# Patient Record
Sex: Female | Born: 1970 | Race: White | Hispanic: No | Marital: Married | Smoking: Never smoker
Health system: Southern US, Community
[De-identification: ages and names within clinical notes are randomized; demographics above are authoritative.]

## PROBLEM LIST (undated history)

## (undated) DIAGNOSIS — E669 Obesity, unspecified: Secondary | ICD-10-CM

## (undated) HISTORY — DX: Obesity, unspecified: E66.9

## (undated) HISTORY — PX: GASTRIC BYPASS: SHX52

## (undated) HISTORY — PX: CHOLECYSTECTOMY: SHX55

---

## 2005-06-26 ENCOUNTER — Emergency Department: Payer: Self-pay | Admitting: Unknown Physician Specialty

## 2009-04-25 ENCOUNTER — Ambulatory Visit: Payer: Self-pay | Admitting: Family Medicine

## 2009-05-02 ENCOUNTER — Ambulatory Visit: Payer: Self-pay | Admitting: Family Medicine

## 2009-12-18 IMAGING — CT CT ABD-PELV W/ CM
1 of 2 series · 15 of 32 positions shown, 19 images · non-contrast
Comparison: none

REASON FOR EXAM: abdominal pain  nodular density seen in last ct done
[DATE]
COMMENTS:

[Series 3: abdomen · axial · 0.82mm/px · z∈[+32,+440]mm · 15 of 148 slices shown, 19 images]
[im 6/148  soft-tissue]
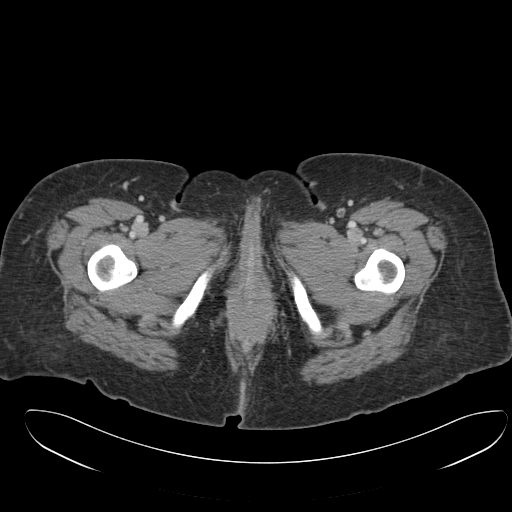
[im 6/148  bone]
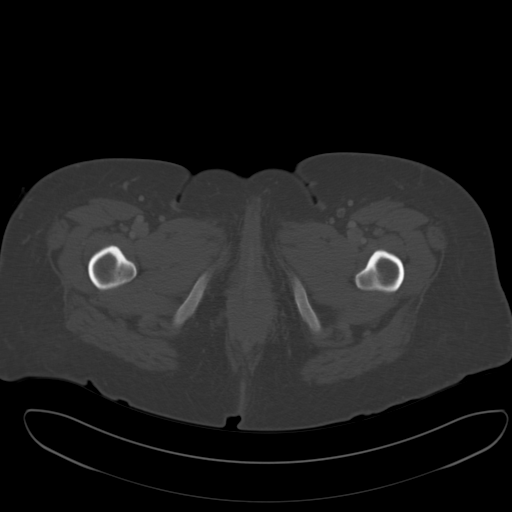
[im 18/148  soft-tissue]
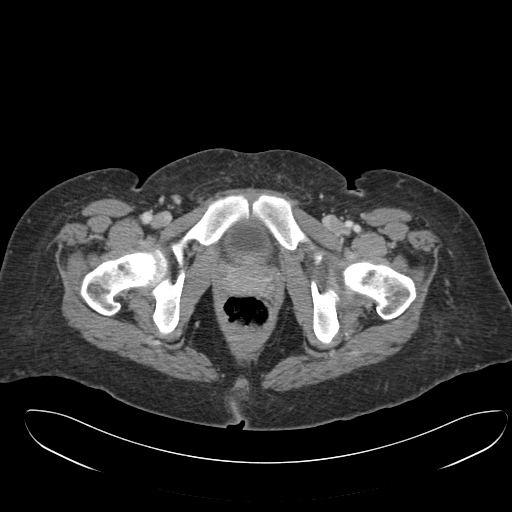
[im 30/148  soft-tissue]
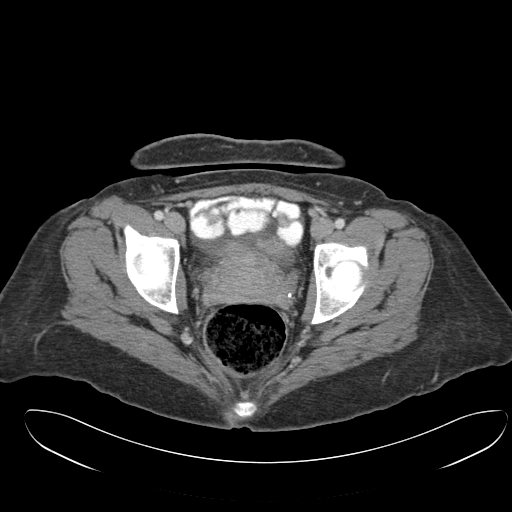
[im 42/148  soft-tissue]
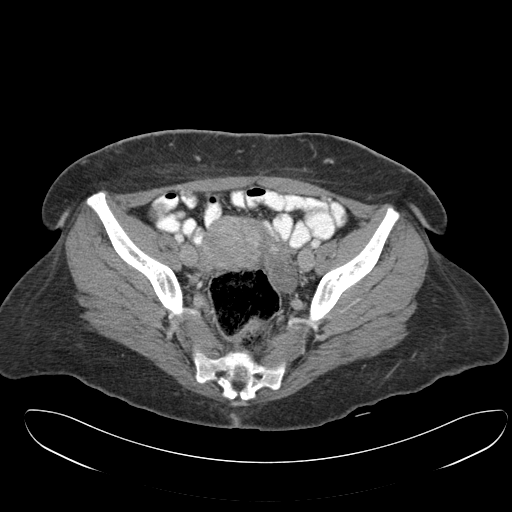
[im 53/148  soft-tissue]
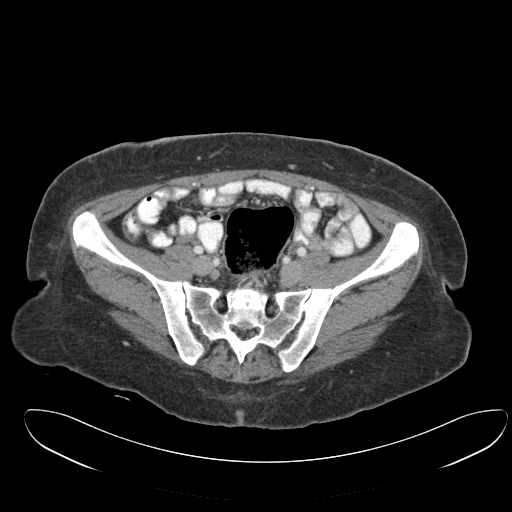
[im 65/148  soft-tissue]
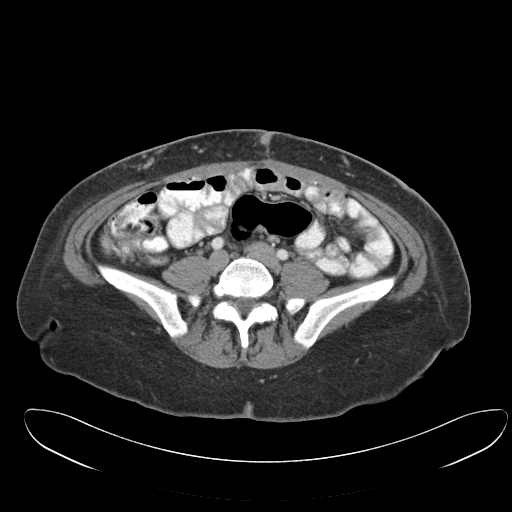
[im 77/148  soft-tissue]
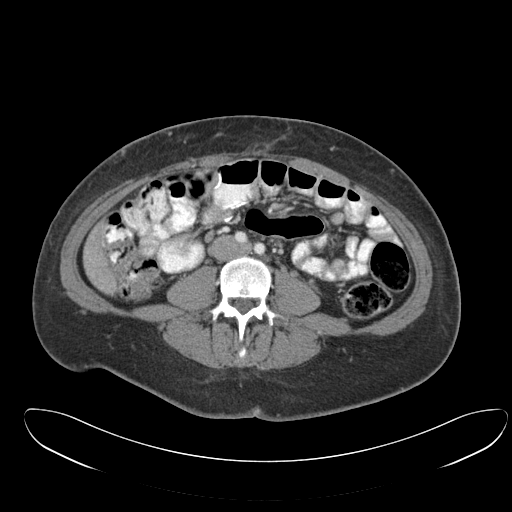
[im 83/148  soft-tissue]
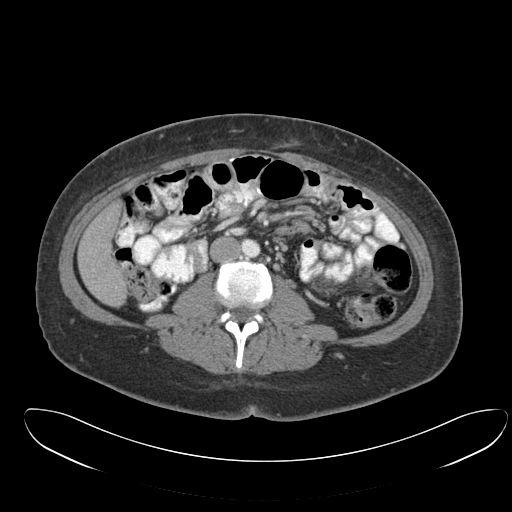
[im 95/148  soft-tissue]
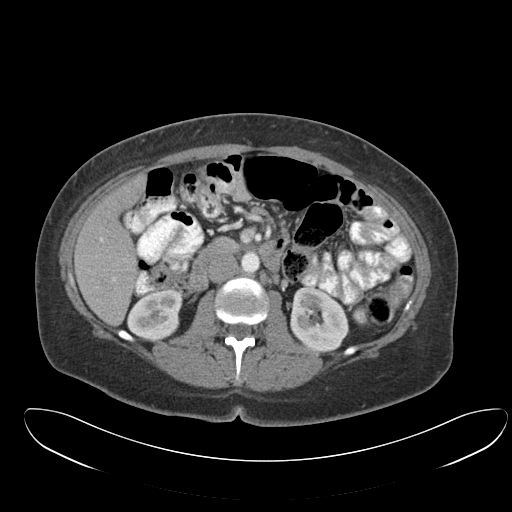
[im 95/148  bone]
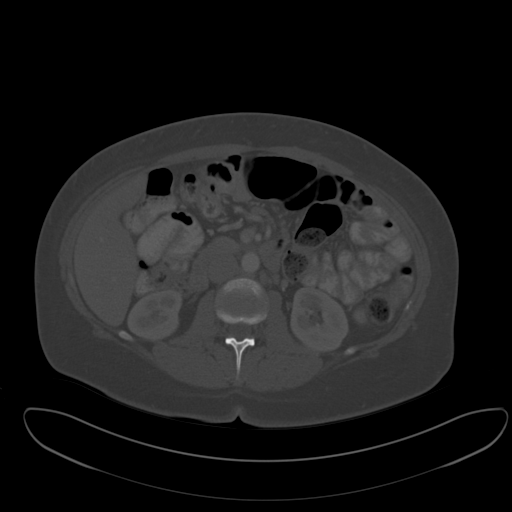
[im 106/148  soft-tissue]
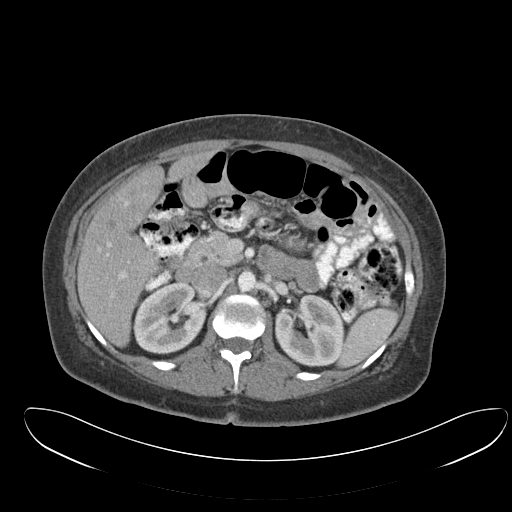
[im 118/148  soft-tissue]
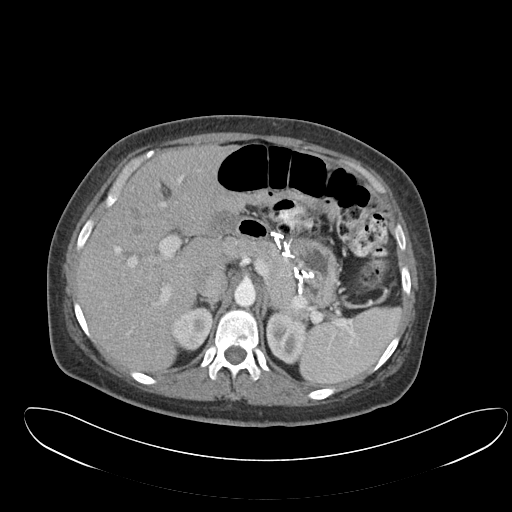
[im 124/148  lung]
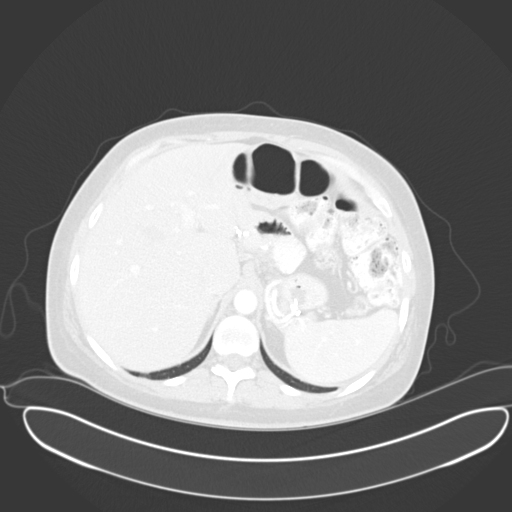
[im 130/148  soft-tissue]
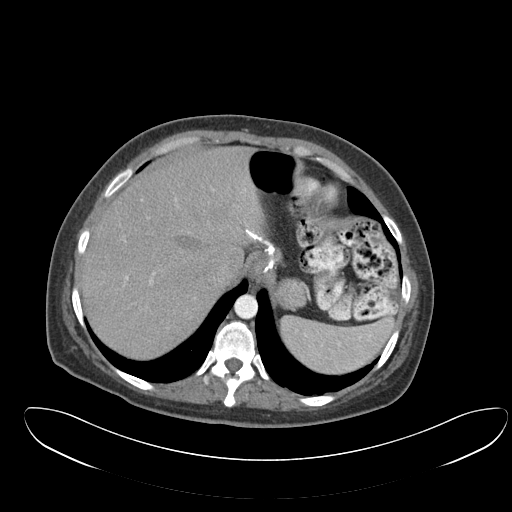
[im 130/148  lung]
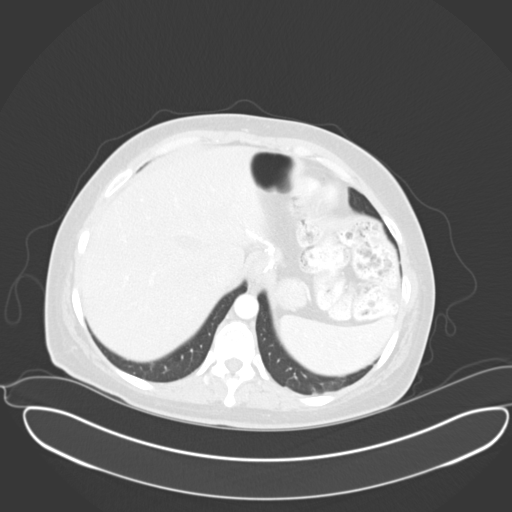
[im 136/148  lung]
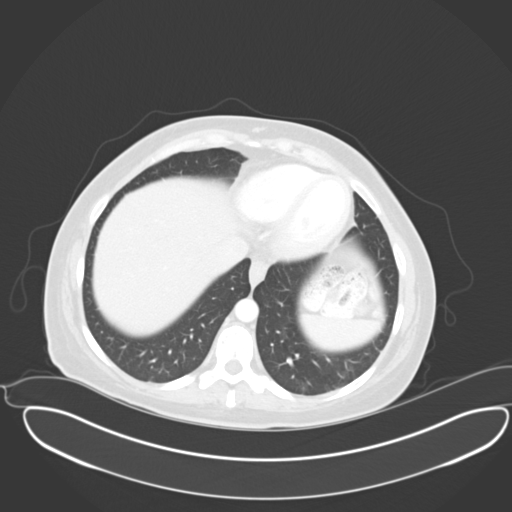
[im 142/148  soft-tissue]
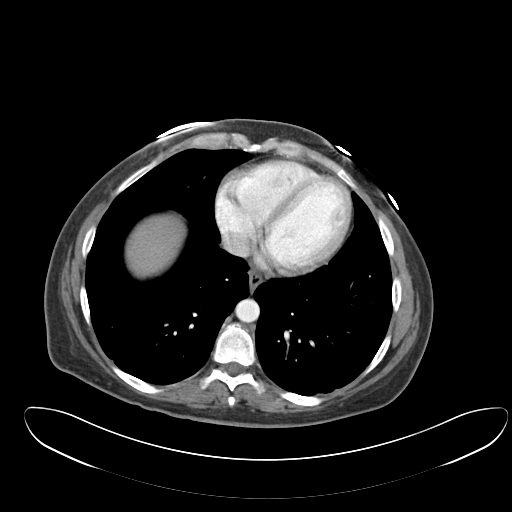
[im 142/148  lung]
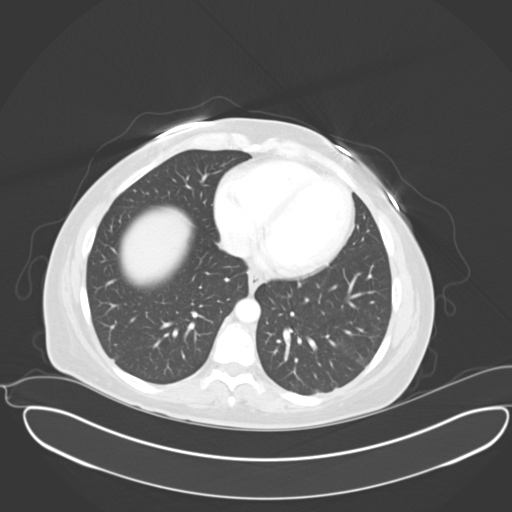

[15 of 32 positions shown; findings below may reference images not displayed]

PROCEDURE:     CT  - CT ABDOMEN / PELVIS  W  - May 02, 2009  [DATE]

RESULT:     CT of the abdomen and pelvis is performed utilizing 100 ml of
[id number] iodinated intravenous contrast along with oral contrast.
Comparison is made to a non-contrast exam dated 04/25/2009.

Images through the base of the lungs demonstrate grossly normal aeration.
There is some minimal subpleural density which is likely secondary to
atelectasis in the dependent regions.

The nodular densities in the retroperitoneum seen previously are likely
secondary to a prominent crus of the diaphragm. On the left there are
gonadal veins demonstrated which may account for some of the nodular density
as well. There does not appear to be definite adenopathy. There is no acute
inflammation. The aorta enhances normally. The liver, spleen, kidneys and
pancreas appear to be within normal limits. There is no abnormal bowel
distention, free air or abnormal fluid collection aside from the previously
demonstrated left adnexal cyst. The appendix is seen and is normal.
IMPRESSION: No definite evidence of retroperitoneal adenopathy or mass.
The appearance was secondary to a prominent interaorto-crus of the diaphragm
and some para-aortic prominence of the gonadal vein.

## 2011-11-24 ENCOUNTER — Emergency Department: Payer: Self-pay | Admitting: Emergency Medicine

## 2013-05-06 ENCOUNTER — Emergency Department: Payer: Self-pay | Admitting: Emergency Medicine

## 2013-05-06 LAB — COMPREHENSIVE METABOLIC PANEL
Alkaline Phosphatase: 52 U/L (ref 50–136)
Anion Gap: 4 — ABNORMAL LOW (ref 7–16)
BUN: 11 mg/dL (ref 7–18)
Bilirubin,Total: 0.3 mg/dL (ref 0.2–1.0)
Calcium, Total: 8.7 mg/dL (ref 8.5–10.1)
Chloride: 107 mmol/L (ref 98–107)
Creatinine: 0.81 mg/dL (ref 0.60–1.30)
EGFR (African American): >60
Glucose: 76 mg/dL (ref 65–99)
Osmolality: 274 (ref 275–301)
SGOT(AST): 28 U/L (ref 15–37)
Sodium: 138 mmol/L (ref 136–145)

## 2013-05-06 LAB — URINALYSIS, COMPLETE
Ph: 6 (ref 4.5–8.0)
Protein: NEGATIVE
RBC,UR: 5 /HPF (ref 0–5)
Specific Gravity: 1.014 (ref 1.003–1.030)
Squamous Epithelial: 7
WBC UR: 6 /HPF (ref 0–5)

## 2013-05-06 LAB — CBC
MCH: 28.2 pg (ref 26.0–34.0)
MCHC: 33.2 g/dL (ref 32.0–36.0)
MCV: 85 fL (ref 80–100)
Platelet: 220 10*3/uL (ref 150–440)
RBC: 4.78 10*6/uL (ref 3.80–5.20)
RDW: 15.3 % — ABNORMAL HIGH (ref 11.5–14.5)
WBC: 9.4 10*3/uL (ref 3.6–11.0)

## 2013-05-06 LAB — LIPASE, BLOOD: Lipase: 230 U/L (ref 73–393)

## 2013-12-22 IMAGING — CR DG ABDOMEN 1V
1 series · 2 of 2 positions shown · non-contrast
Comparison: none

REASON FOR EXAM: right sided abdominal pain
COMMENTS:

PROCEDURE:     DXR - DXR KIDNEY URETER BLADDER  - May 06, 2013  [DATE]
RESULT:

[Series 1: t abdomen supine · 0.14mm/px · 2 of 2 slices shown]
[im 1/2]
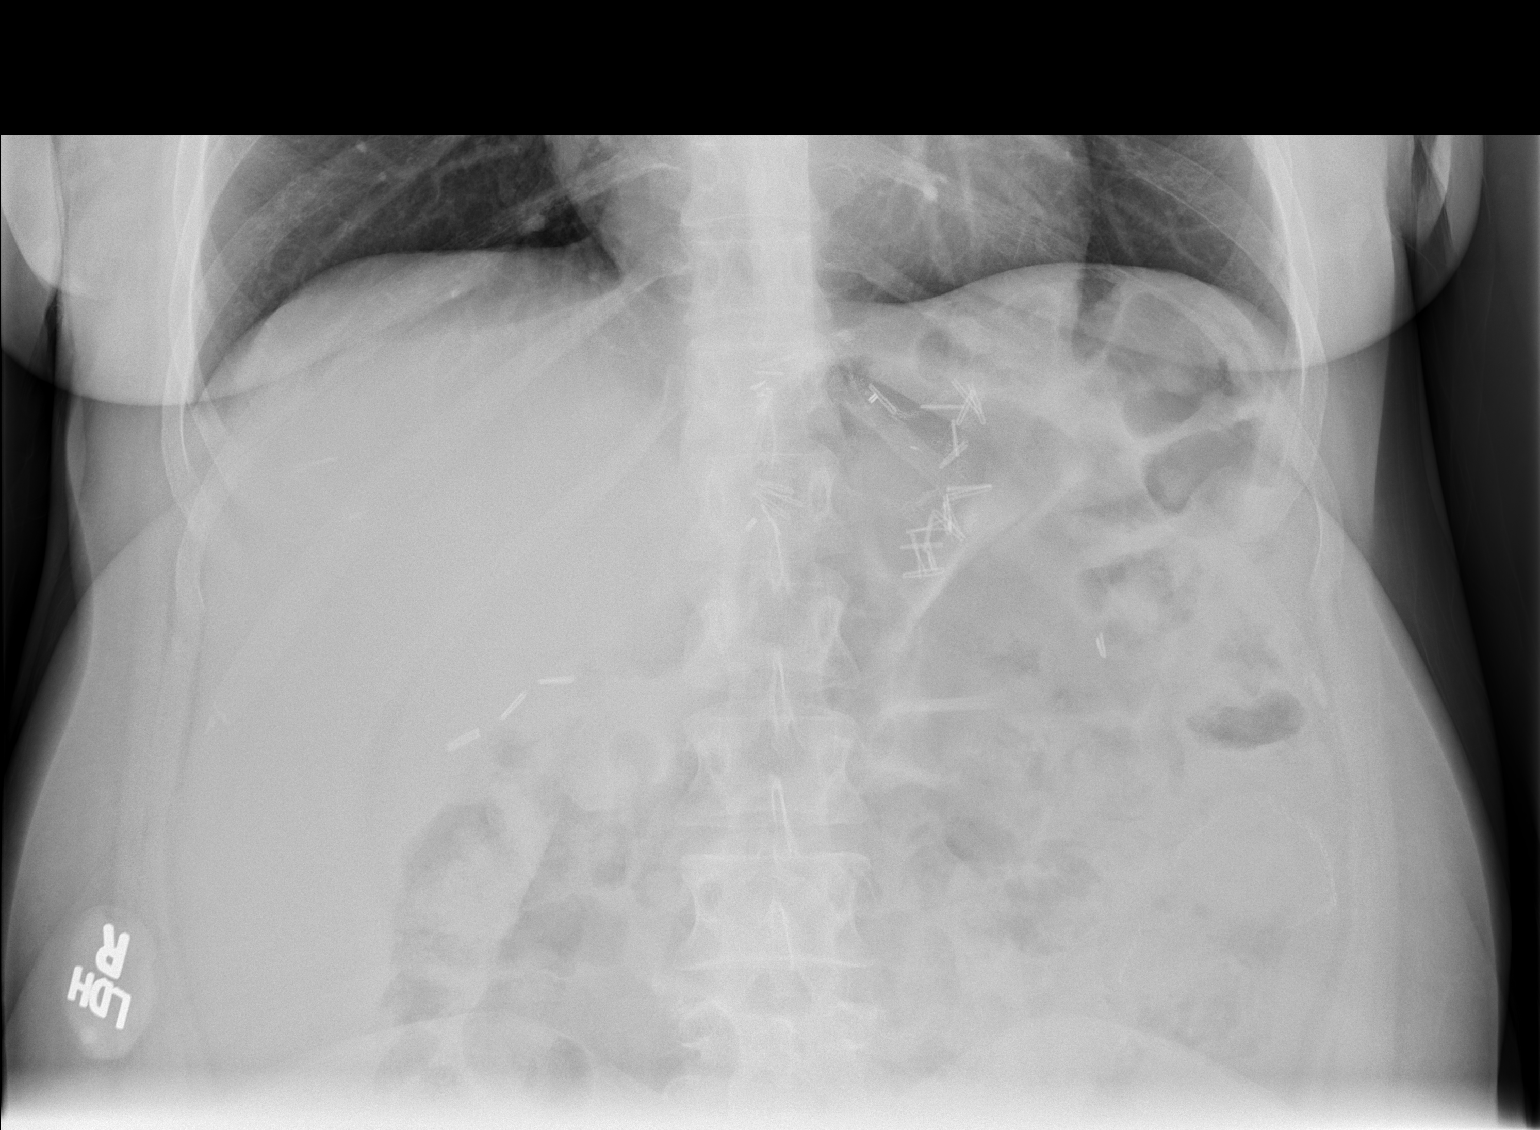
[im 2/2]
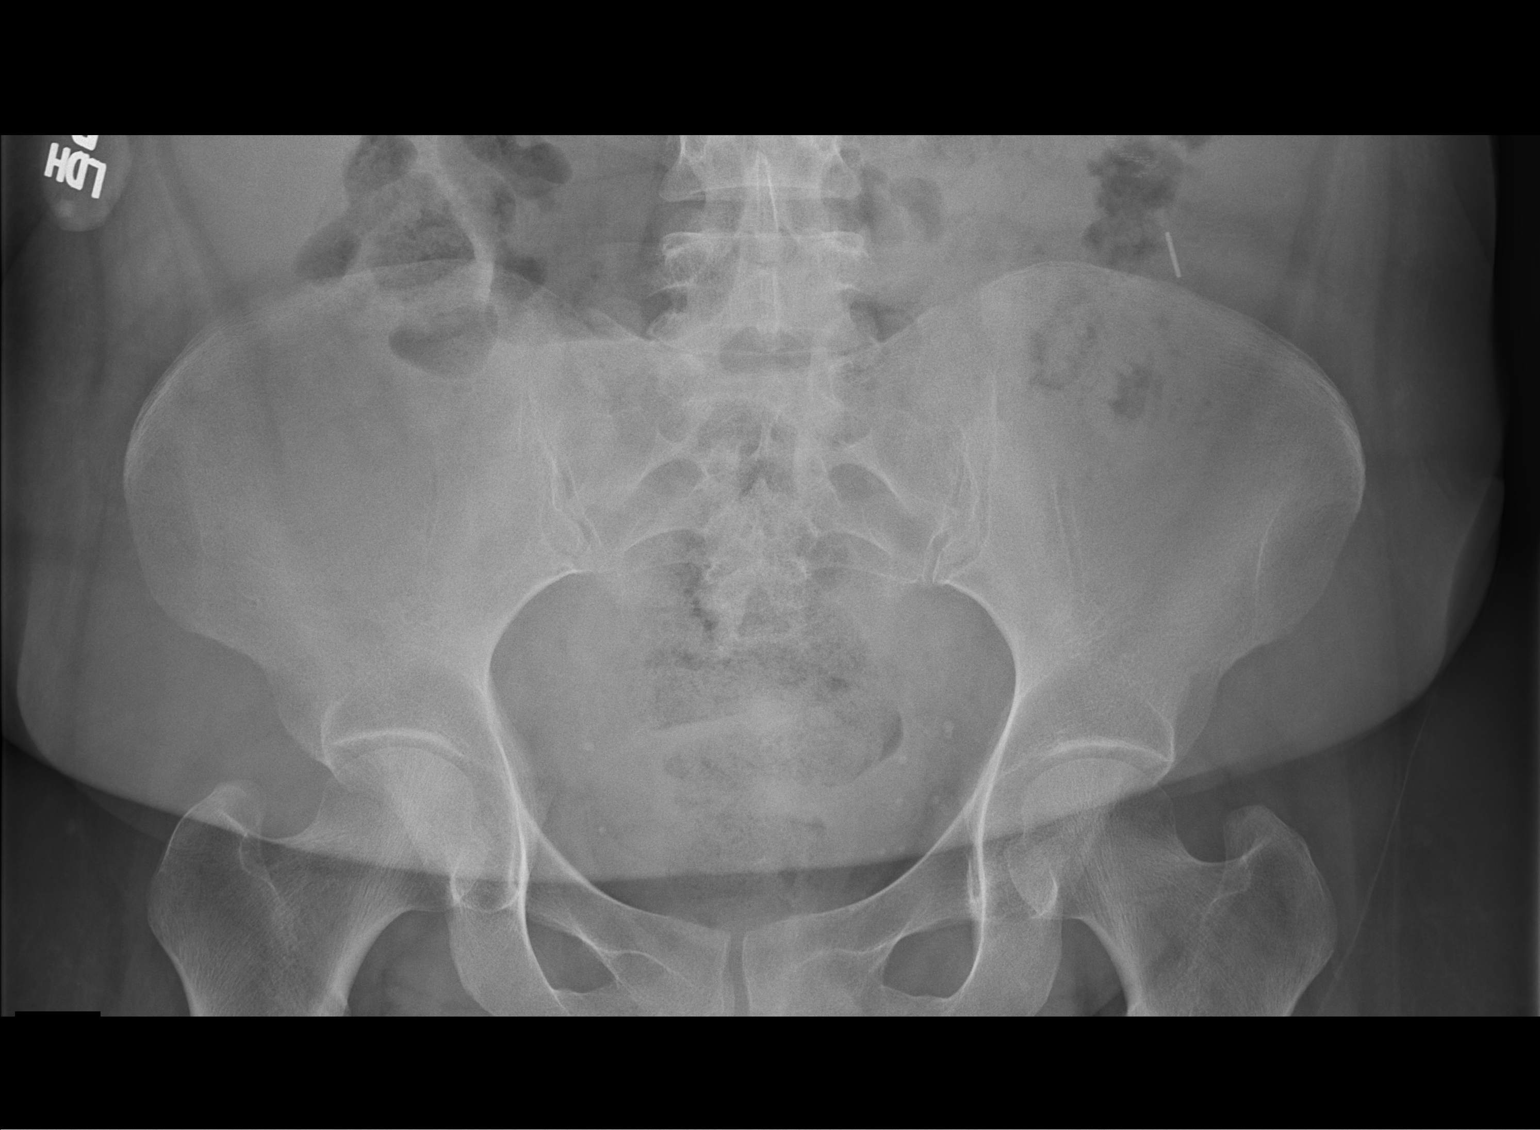

[2 of 2 positions shown; findings below may reference images not displayed]

FINDINGS: Air is seen within nondilated loops of large and small bowel. A
moderate to large amount of stool is appreciated. There does not appear to
be evidence of calcified densities projecting along the expected course of
the right or left ureter. The visualized bony skeleton demonstrates no
evidence of fracture or dislocation.
IMPRESSION: Nonobstructive bowel gas pattern with a moderate to large
amount of stool.

## 2015-11-23 ENCOUNTER — Ambulatory Visit
Admission: EM | Admit: 2015-11-23 | Discharge: 2015-11-23 | Disposition: A | Discharge status: Home / Self Care | Payer: BLUE CROSS/BLUE SHIELD | Attending: Family Medicine | Admitting: Family Medicine

## 2015-11-23 DIAGNOSIS — H6983 Other specified disorders of Eustachian tube, bilateral: Secondary | ICD-10-CM | POA: Diagnosis not present

## 2015-11-23 DIAGNOSIS — J0110 Acute frontal sinusitis, unspecified: Secondary | ICD-10-CM

## 2015-11-23 DIAGNOSIS — R05 Cough: Secondary | ICD-10-CM

## 2015-11-23 DIAGNOSIS — R059 Cough, unspecified: Secondary | ICD-10-CM

## 2015-11-23 MED ORDER — AMOXICILLIN-POT CLAVULANATE 875-125 MG PO TABS: 1.0000 | ORAL_TABLET | Freq: Two times a day (BID) | ORAL | Status: AC

## 2015-11-23 MED ORDER — BENZONATATE 100 MG PO CAPS: 100.0000 mg | ORAL_CAPSULE | Freq: Three times a day (TID) | ORAL | Status: DC | PRN

## 2015-11-23 NOTE — ED Notes (Signed)
Patient complains of sinus pain and pressure with drainage and congestion. Patient complains of facial pain. States that symptoms started around 2-3 weeks ago and she is just unable to get rid of symptoms. Patient states that she has been also having a headache and has tried multiple OTC treatment for sinus pain without relief.

## 2015-11-23 NOTE — ED Provider Notes (Signed)
CSN: [id number]     Arrival date & time 11/23/15  1017 History   First MD Initiated Contact with Patient 11/23/15 1428     Chief Complaint  Patient presents with  . Sinusitis   (Consider location/radiation/quality/duration/timing/severity/associated sxs/prior Treatment) HPI   45 yo F with about 2 1/2-3 week hx of nasal congestion and cough, stuffy ears, runny nose.  - now with facial pain , upper tooth pain and bilaterally full feeling ears. Has tried many OTC products with short term or minimal improvemment. Ears feel underwater, head is in a box..."and now my face hurts" Using Nyquil to sleep. Has Flonase and cetirizine  but hasn't been using because it is not summer yet.   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 Hard work.   History reviewed. No pertinent past medical history. Past Surgical History  Procedure Laterality Date  . Cesarean section    . Cholecystectomy    . Gastric bypass     Family History  Problem Relation Age of Onset  . Lung cancer Father    Social History  Substance Use Topics  . Smoking status: Never Smoker   . Smokeless tobacco: None  . Alcohol Use: No   OB History    No data available     Review of Systems Review of 10 systems negative for acute change except as referenced in HPI   Allergies  Review of patient's allergies indicates no known allergies.  Home Medications   Prior to Admission medications   Medication Sig Start Date End Date Taking? Authorizing Provider  amoxicillin-clavulanate (AUGMENTIN) 875-125 MG tablet Take 1 tablet by mouth every 12 (twelve) hours. 11/23/15 12/03/15  Jan Fireman, PA-C  benzonatate (TESSALON) 100 MG capsule Take 1 capsule (100 mg total) by mouth 3 (three) times daily as needed. 11/23/15   Jan Fireman, PA-C   Meds Ordered and Administered this Visit  Medications - No data to display  BP 144/93 mmHg  Pulse 64  Temp(Src) 97.8 F (36.6 C) (Tympanic)  Resp 16  Ht 5' 3"  (1.6 m)   Wt 195 lb (88.451 kg)  BMI 34.55 kg/m2  SpO2 100% No data found.   Physical Exam  VS noted, WNL, pleasant F obviously tired of not feeling well GENERAL : NAD HEENT: no pharyngeal erythema,no exudate, cobblestoning B canals clear,no erythema of TMs, bilateral retraction with deviated light reflex  no cervical LAD RESP: CTA  B , no wheezing, no accessory muscle use CARD: RRR ABD: Not distended, non-tender MSK/Skin- reports an area on anterior right upper arm about the size of a silver dollar that intermitttently "gets tingly feeling"- there is nothing obvious clinically at this time, no rash or irritation, annular presentation reported, not dermatome; Skin sensitivity to touch grossly normal and DTRs appropriate full range of motion shoulder and neck.Odd feeling not currently present. NEURO: Good attention, good recall, no gross neuro defecit PSYCH: speech and behavior appropriate  ED Course  Procedures (including critical care time)  Labs Review Labs Reviewed - No data to display  Imaging Review No results found.    MDM   1. Acute frontal sinusitis, recurrence not specified   2. Cough   3. Eustachian tube dysfunction, bilateral     Diagnosis and treatment discussed.-handout given  Retrun to cetirizine and flonase--use both BID for about 3 days, then reduce to daily.  Add Augmentin, increase fluids/hydrations/steamy showers/vaporizer/gentle valslava Continue NyQuil if desired  Questions fielded, expectations and recommendations  reviewed. Discussed follow up and return parameters including no resolution or any worsening condition. . Patient expresses understanding  and agrees to plan.  Will return to [facility] with questions, concerns or exacerbation.   Discharge Medication List as of 11/23/2015  2:47 PM    START taking these medications   Details  amoxicillin-clavulanate (AUGMENTIN) 875-125 MG tablet Take 1 tablet by mouth every 12 (twelve) hours., Starting 11/23/2015,  Until Tue 12/03/15, Normal    benzonatate (TESSALON) 100 MG capsule Take 1 capsule (100 mg total) by mouth 3 (three) times daily as needed., Starting 11/23/2015, Until Discontinued, Normal          DWAN FENNEL, PA-C 11/25/15 1450

## 2015-11-23 NOTE — Discharge Instructions (Signed)
Antibiotic Rx 1 tablet twice daily for 10 days Tessalon perles 1 -2 every 6 hours as needed for cough Robitussin DM 1 tsp every 4-6 hrs Steamy showers- increase fluids by mouth- void about every 2 hours  Sinusitis, Adult Sinusitis is redness, soreness, and inflammation of the paranasal sinuses. Paranasal sinuses are air pockets within the bones of your face. They are located beneath your eyes, in the middle of your forehead, and above your eyes. In healthy paranasal sinuses, mucus is able to drain out, and air is able to circulate through them by way of your nose. However, when your paranasal sinuses are inflamed, mucus and air can become trapped. This can allow bacteria and other germs to grow and cause infection. Sinusitis can develop quickly and last only a short time (acute) or continue over a long period (chronic). Sinusitis that lasts for more than 12 weeks is considered chronic. CAUSES Causes of sinusitis include:  Allergies.  Structural abnormalities, such as displacement of the cartilage that separates your nostrils (deviated septum), which can decrease the air flow through your nose and sinuses and affect sinus drainage.  Functional abnormalities, such as when the small hairs (cilia) that line your sinuses and help remove mucus do not work properly or are not present. SIGNS AND SYMPTOMS Symptoms of acute and chronic sinusitis are the same. The primary symptoms are pain and pressure around the affected sinuses. Other symptoms include:  Upper toothache.  Earache.  Headache.  Bad breath.  Decreased sense of smell and taste.  A cough, which worsens when you are lying flat.  Fatigue.  Fever.  Thick drainage from your nose, which often is green and may contain pus (purulent).  Swelling and warmth over the affected sinuses. DIAGNOSIS Your health care provider will perform a physical exam. During your exam, your health care provider may perform any of the following to help  determine if you have acute sinusitis or chronic sinusitis:  Look in your nose for signs of abnormal growths in your nostrils (nasal polyps).  Tap over the affected sinus to check for signs of infection.  View the inside of your sinuses using an imaging device that has a light attached (endoscope). If your health care provider suspects that you have chronic sinusitis, one or more of the following tests may be recommended:  Allergy tests.  Nasal culture. A sample of mucus is taken from your nose, sent to a lab, and screened for bacteria.  Nasal cytology. A sample of mucus is taken from your nose and examined by your health care provider to determine if your sinusitis is related to an allergy. TREATMENT Most cases of acute sinusitis are related to a viral infection and will resolve on their own within 10 days. Sometimes, medicines are prescribed to help relieve symptoms of both acute and chronic sinusitis. These may include pain medicines, decongestants, nasal steroid sprays, or saline sprays. However, for sinusitis related to a bacterial infection, your health care provider will prescribe antibiotic medicines. These are medicines that will help kill the bacteria causing the infection. Rarely, sinusitis is caused by a fungal infection. In these cases, your health care provider will prescribe antifungal medicine. For some cases of chronic sinusitis, surgery is needed. Generally, these are cases in which sinusitis recurs more than 3 times per year, despite other treatments. HOME CARE INSTRUCTIONS  Drink plenty of water. Water helps thin the mucus so your sinuses can drain more easily.  Use a humidifier.  Inhale steam 3-4 times a  day (for example, sit in the bathroom with the shower running).  Apply a warm, moist washcloth to your face 3-4 times a day, or as directed by your health care provider.  Use saline nasal sprays to help moisten and clean your sinuses.  Take medicines only as  directed by your health care provider.  If you were prescribed either an antibiotic or antifungal medicine, finish it all even if you start to feel better. SEEK IMMEDIATE MEDICAL CARE IF:  You have increasing pain or severe headaches.  You have nausea, vomiting, or drowsiness.  You have swelling around your face.  You have vision problems.  You have a stiff neck.  You have difficulty breathing.   This information is not intended to replace advice given to you by your health care provider. Make sure you discuss any questions you have with your health care provider.   Document Released: 09/21/2005 Document Revised: 10/12/2014 Document Reviewed: 10/06/2011 Elsevier Interactive Patient Education 2016 [employer] media is inflammation of your middle ear. This occurs when the auditory tube (eustachian tube) leading from the back of your nose (nasopharynx) to your eardrum is blocked. This blockage may result from a cold, environmental allergies, or an upper respiratory infection. Unresolved barotitis media may lead to damage or hearing loss (barotrauma), which may become permanent. HOME CARE INSTRUCTIONS   Use medicines as recommended by your health care provider. Over-the-counter medicines will help unblock the canal and can help during times of air travel.  Do not put anything into your ears to clean or unplug them. Eardrops will not be helpful.  Do not swim, dive, or fly until your health care provider says it is all right to do so. If these activities are necessary, chewing gum with frequent, forceful swallowing may help. It is also helpful to hold your nose and gently blow to pop your ears for equalizing pressure changes. This forces air into the eustachian tube.  Only take over-the-counter or prescription medicines for pain, discomfort, or fever as directed by your health care provider.  A decongestant may be helpful in decongesting the middle ear and  make pressure equalization easier. SEEK MEDICAL CARE IF:  You experience a serious form of dizziness in which you feel as if the room is spinning and you feel nauseated (vertigo).  Your symptoms only involve one ear. SEEK IMMEDIATE MEDICAL CARE IF:   You develop a severe headache, dizziness, or severe ear pain.  You have bloody or pus-like drainage from your ears.  You develop a fever.  Your problems do not improve or become worse. MAKE SURE YOU:   Understand these instructions.  Will watch your condition.  Will get help right away if you are not doing well or get worse.   This information is not intended to replace advice given to you by your health care provider. Make sure you discuss any questions you have with your health care provider.   Document Released: 09/18/2000 Document Revised: 07/12/2013 Document Reviewed: 04/18/2013 Elsevier Interactive Patient Education [employer].

## 2015-11-25 ENCOUNTER — Encounter: Payer: Self-pay | Admitting: Physician Assistant

## 2015-11-27 ENCOUNTER — Encounter: Payer: Self-pay | Admitting: *Deleted

## 2015-11-27 ENCOUNTER — Ambulatory Visit
Admission: EM | Admit: 2015-11-27 | Discharge: 2015-11-27 | Disposition: A | Discharge status: Home / Self Care | Payer: BLUE CROSS/BLUE SHIELD | Attending: Family Medicine | Admitting: Family Medicine

## 2015-11-27 ENCOUNTER — Telehealth: Payer: Self-pay | Admitting: Emergency Medicine

## 2015-11-27 DIAGNOSIS — H9202 Otalgia, left ear: Secondary | ICD-10-CM

## 2015-11-27 NOTE — Discharge Instructions (Signed)
Continue home medication. Keep ear dry. Drink plenty of fluids.   Expect a phone call or call urgent care back today for ENT follow up information.   Follow up with ENT closely. See above to call as needed.   Follow up with your primary care physician this week as needed. Return to Urgent care for new or worsening concerns.     Earache An earache, also called otalgia, can be caused by many things. Pain from an earache can be sharp, dull, or burning. The pain may be temporary or constant. Earaches can be caused by problems with the ear, such as infection in either the middle ear or the ear canal, injury, impacted ear wax, middle ear pressure, or a foreign body in the ear. Ear pain can also result from problems in other areas. This is called referred pain. For example, pain can come from a sore throat, a tooth infection, or problems with the jaw or the joint between the jaw and the skull (temporomandibular joint, or TMJ). The cause of an earache is not always easy to identify. Watchful waiting may be appropriate for some earaches until a clear cause of the pain can be found. HOME CARE INSTRUCTIONS Watch your condition for any changes. The following actions may help to lessen any discomfort that you are feeling:  Take medicines only as directed by your health care provider. This includes ear drops.  Apply ice to your outer ear to help reduce pain.  Put ice in a plastic bag.  Place a towel between your skin and the bag.  Leave the ice on for 20 minutes, 2-3 times per day.  Do not put anything in your ear other than medicine that is prescribed by your health care provider.  Try resting in an upright position instead of lying down. This may help to reduce pressure in the middle ear and relieve pain.  Chew gum if it helps to relieve your ear pain.  Control any allergies that you have.  Keep all follow-up visits as directed by your health care provider. This is important. SEEK MEDICAL  CARE IF:  Your pain does not improve within 2 days.  You have a fever.  You have new or worsening symptoms. SEEK IMMEDIATE MEDICAL CARE IF:  You have a severe headache.  You have a stiff neck.  You have difficulty swallowing.  You have redness or swelling behind your ear.  You have drainage from your ear.  You have hearing loss.  You feel dizzy.   This information is not intended to replace advice given to you by your health care provider. Make sure you discuss any questions you have with your health care provider.   Document Released: 05/08/2004 Document Revised: 10/12/2014 Document Reviewed: 04/22/2014 Elsevier Interactive Patient Education [employer].

## 2015-11-27 NOTE — ED Notes (Signed)
Recent head cold over past 2 weeks, pt was seen here Saturday. Today c/o left ear pain, fullness, drainage, and bleeding, onset Saturday.

## 2015-11-27 NOTE — ED Provider Notes (Signed)
[facility]  ____________________________________________  Time seen: Approximately 9:07 AM  I have reviewed the triage vital signs and the nursing notes.   HISTORY  Chief Complaint Otalgia; Ear Drainage; and Ear Fullness   HPI Traci Taylor is a 45 y.o. female  presents with a complaint of left ear discomfort and seeing some blood from left ear today. Patient reports the last 2 weeks she has had nasal congestion, sinus pressure and sinus drainage. Patient reports that she was seen in urgent care 4 days ago and diagnosed with a sinus infection and started on oral Augmentin and Tessalon Perles. Patient states the sinus pressure and sinus congestion have improved. States that she has continued with bilateral ear congestion phone as well as fullness feeling. Patient states that she woke up this morning feeling she had fluid in her ear and a lightly checked with a Q-tip and noticed that there is blood present. Denies hearing changes. Denies ringing in ears. Denies head trauma or other trauma.  States left ear discomfort is mild at this time at 3 out of 10. Denies pain radiation. Denies any pain. Denies headache, nausea, vomiting, dizziness, chest pain, shortness breath, abdominal pain or dysuria.   History reviewed. No pertinent past medical history.  There are no active problems to display for this patient.   Past Surgical History  Procedure Laterality Date  . Cesarean section    . Cholecystectomy    . Gastric bypass      Current Outpatient Rx  Name  Route  Sig  Dispense  Refill  . amoxicillin-clavulanate (AUGMENTIN) 875-125 MG tablet   Oral   Take 1 tablet by mouth every 12 (twelve) hours.   20 tablet   0   . benzonatate (TESSALON) 100 MG capsule   Oral   Take 1 capsule (100 mg total) by mouth 3 (three) times daily as needed.   30 capsule   0   .           Claritin-D  Allergies Review of patient's allergies indicates no known allergies.  Family History   Problem Relation Age of Onset  . Lung cancer Father     Social History Social History  Substance Use Topics  . Smoking status: Never Smoker   . Smokeless tobacco: None  . Alcohol Use: No    Review of Systems Constitutional: No fever/chills Eyes: No visual changes. ENT: No sore throat. Positive ear discomfort. Positive runny nose nasal congestion. Cardiovascular: Denies chest pain. Respiratory: Denies shortness of breath. Gastrointestinal: No abdominal pain.  No nausea, no vomiting.  No diarrhea.  No constipation. Genitourinary: Negative for dysuria. Musculoskeletal: Negative for back pain. Skin: Negative for rash. Neurological: Negative for headaches, focal weakness or numbness.  10-point ROS otherwise negative.  ____________________________________________   PHYSICAL EXAM:  VITAL SIGNS: ED Triage Vitals  Enc Vitals Group     BP 11/27/15 0841 154/105 mmHg     Pulse Rate 11/27/15 0841 65     Resp 11/27/15 0841 16     Temp 11/27/15 0841 97.9 F (36.6 C)     Temp Source 11/27/15 0841 Oral     SpO2 11/27/15 0841 100 %     Weight 11/27/15 0841 195 lb (88.451 kg)     Height 11/27/15 0841 5' 3"  (1.6 m)     Head Cir --      Peak Flow --      Pain Score 11/27/15 0846 7     Pain Loc --  Pain Edu? --      Excl. in [place]? --     Constitutional: Alert and oriented. Well appearing and in no acute distress. Eyes: Conjunctivae are normal. PERRL. EOMI. Head: Atraumatic. Minimal bilateral frontal and maxillary sinusitis palpation. No swelling. No erythema.  Ears: Right: No erythema, no exudate or drainage, nontender, normal TM. Left: Scant amount of dried blood in your canal. Nontender to palpation. No surrounding swelling or erythema. Internal ear moderate erythema with swelling bulge positioned at 8 to 12:00 with mild active bleeding, and unable to visualize if TM intact. No purulent exudate or other drainage. Hearing grossly intact bilaterally.  Nose: No  congestion/rhinnorhea.  Mouth/Throat: Mucous membranes are moist.  Oropharynx non-erythematous. Neck: No stridor.  No cervical spine tenderness to palpation. Hematological/Lymphatic/Immunilogical: No cervical lymphadenopathy. Cardiovascular: Normal rate, regular rhythm. Grossly normal heart sounds.  Good peripheral circulation. Respiratory: Normal respiratory effort.  No retractions. Lungs CTAB. Gastrointestinal: Soft and nontender. No distention. Normal Bowel sounds.  No abdominal bruits. No CVA tenderness. Musculoskeletal: No lower or upper extremity tenderness nor edema.  No joint effusions. Bilateral pedal pulses equal and easily palpated.  Neurologic:  Normal speech and language. No gross focal neurologic deficits are appreciated. No gait instability. Skin:  Skin is warm, dry and intact. No rash noted. Psychiatric: Mood and affect are normal. Speech and behavior are normal.  ____________________________________________   LABS (all labs ordered are listed, but only abnormal results are displayed)  Labs Reviewed - No data to display ____________________________________________  INITIAL IMPRESSION / ASSESSMENT AND PLAN / ED COURSE  Pertinent labs & imaging results that were available during my care of the patient were reviewed by me and considered in my medical decision making (see chart for details).  Very well-appearing patient. No acute distress. Presents for the complaints of left ear pain and bleeding. Reports sinusitis improving on oral Augmentin and Tessalon Perles. Left ear active bleeding with swelling 8-12 oclock internal left ear, unclear if TM ruptured.Recommend very close ENT follow up. Called [facility] to obtain follow-up appointment, and request note be faxed prior to obtain an appointment. Information faxed by Romie Minus. Patient directed to call back to urgent care if she has not heard from urgent care despite this afternoon to obtain ENT follow-up information. ENT  information given to patient. Patient to continue home antibiotics as well as cough medicine as needed. Encourage keeping ear dry. Rest, fluids, piece be follow-up.  Discussed follow up with Primary care physician this week. Discussed follow up and return parameters including no resolution or any worsening concerns. Patient verbalized understanding and agreed to plan.   ____________________________________________   FINAL CLINICAL IMPRESSION(S) / ED DIAGNOSES  Final diagnoses:  Left ear pain      Note: This dictation was prepared with Dragon dictation along with smaller phrase technology. Any transcriptional errors that result from this process are unintentional.    Marylene Land, NP 11/27/15 [phone]

## 2015-12-10 LAB — BODY FLUID CELL COUNT WITH DIFFERENTIAL

## 2016-06-05 DIAGNOSIS — J0110 Acute frontal sinusitis, unspecified: Secondary | ICD-10-CM | POA: Diagnosis not present

## 2016-06-05 DIAGNOSIS — R05 Cough: Secondary | ICD-10-CM | POA: Diagnosis not present

## 2016-06-05 DIAGNOSIS — H6983 Other specified disorders of Eustachian tube, bilateral: Secondary | ICD-10-CM | POA: Diagnosis not present

## 2016-06-19 LAB — FIBRIN DEGRADATION PROD.(ARMC ONLY)

## 2017-10-27 ENCOUNTER — Ambulatory Visit: Payer: BLUE CROSS/BLUE SHIELD | Admitting: Obstetrics and Gynecology

## 2017-10-27 ENCOUNTER — Encounter: Payer: Self-pay | Admitting: Obstetrics and Gynecology

## 2017-10-27 VITALS — BP 128/90 | HR 84 | Ht 63.0 in | Wt 222.0 lb

## 2017-10-27 DIAGNOSIS — N912 Amenorrhea, unspecified: Secondary | ICD-10-CM

## 2017-10-27 DIAGNOSIS — Z1239 Encounter for other screening for malignant neoplasm of breast: Secondary | ICD-10-CM

## 2017-10-27 DIAGNOSIS — Z1231 Encounter for screening mammogram for malignant neoplasm of breast: Secondary | ICD-10-CM

## 2017-10-27 DIAGNOSIS — N951 Menopausal and female climacteric states: Secondary | ICD-10-CM | POA: Diagnosis not present

## 2017-10-27 DIAGNOSIS — N760 Acute vaginitis: Secondary | ICD-10-CM

## 2017-10-27 LAB — POCT WET PREP WITH KOH
CLUE CELLS WET PREP PER HPF POC: NEGATIVE
KOH Prep POC: NEGATIVE
Trichomonas, UA: NEGATIVE
Yeast Wet Prep HPF POC: NEGATIVE

## 2017-10-27 MED ORDER — CLOTRIMAZOLE-BETAMETHASONE 1-0.05 % EX CREA: 1.0000 "application " | TOPICAL_CREAM | Freq: Two times a day (BID) | CUTANEOUS | 0 refills | Status: DC

## 2017-10-27 NOTE — Progress Notes (Signed)
Chief Complaint  Patient presents with  . Menopausal S&S    Hot flashes/ can't sleep/vaginal dryness    HPI:      [name] is a 47 y.o. G1P1001 who LMP was No LMP recorded. Patient is postmenopausal., presents today for NP eval of hot flashes/night sweats for the past 6-8 months, trouble sleeping, and vaginal dryness and itching for the past few months. Pt notes dyspareunia due to vag sx, no lubricants used. She tried monistat-7 for vag itching without relief and OTC hormone therapy support (? Estroven) for a month without relief. Uses feminine wash and no dryer sheets.  Her LMP was about 1 1/2 yrs ago, stopped spontaneously. Had been sporadic prior to cessation. No VB/spotting since then.   Pt is not current on mammos/annuals or paps. Didn't have insurance until recently.   Past Medical History:  Diagnosis Date  . Obesity     Past Surgical History:  Procedure Laterality Date  . CESAREAN SECTION    . CHOLECYSTECTOMY    . GASTRIC BYPASS      Family History  Problem Relation Age of Onset  . Lung cancer Father     Social History   Socioeconomic History  . Marital status: Married    Spouse name: Not on file  . Number of children: Not on file  . Years of education: Not on file  . Highest education level: Not on file  Social Needs  . Financial resource strain: Not on file  . Food insecurity - worry: Not on file  . Food insecurity - inability: Not on file  . Transportation needs - medical: Not on file  . Transportation needs - non-medical: Not on file  Occupational History  . Not on file  Tobacco Use  . Smoking status: Never Smoker  Substance and Sexual Activity  . Alcohol use: No    Alcohol/week: 0.0 oz  . Drug use: No  . Sexual activity: Yes    Birth control/protection: None  Other Topics Concern  . Not on file  Social History Narrative  . Not on file     Current Outpatient Medications:  .  clotrimazole-betamethasone (LOTRISONE) cream, Apply 1  application topically 2 (two) times daily. Apply externally BID for 2 wks, Disp: 15 g, Rfl: 0   ROS:  Review of Systems  Constitutional: Negative for fatigue, fever and unexpected weight change.  Respiratory: Negative for cough, shortness of breath and wheezing.   Cardiovascular: Negative for chest pain, palpitations and leg swelling.  Gastrointestinal: Negative for blood in stool, constipation, diarrhea, nausea and vomiting.  Endocrine: Negative for cold intolerance, heat intolerance and polyuria.  Genitourinary: Positive for dyspareunia. Negative for dysuria, flank pain, frequency, genital sores, hematuria, menstrual problem, pelvic pain, urgency, vaginal bleeding, vaginal discharge and vaginal pain.  Musculoskeletal: Negative for back pain, joint swelling and myalgias.  Skin: Negative for rash.  Neurological: Negative for dizziness, syncope, light-headedness, numbness and headaches.  Hematological: Negative for adenopathy.  Psychiatric/Behavioral: Negative for agitation, confusion, sleep disturbance and suicidal ideas. The patient is not nervous/anxious.      OBJECTIVE:   Vitals:  BP 128/90 (BP Location: Left Arm, Patient Position: Sitting, Cuff Size: Large)   Pulse 84   Ht 5' 3"  (1.6 m)   Wt 222 lb (100.7 kg)   BMI 39.33 kg/m   Physical Exam  Constitutional: She is oriented to person, place, and time and well-developed, well-nourished, and in no distress. Vital signs are normal.  Genitourinary: Vagina normal,  uterus normal, cervix normal, right adnexa normal and left adnexa normal. Uterus is not enlarged. Cervix exhibits no motion tenderness and no tenderness. Right adnexum displays no mass and no tenderness. Left adnexum displays no mass and no tenderness. Vulva exhibits erythema and lesion. Vulva exhibits no exudate, no rash and no tenderness. Vagina exhibits no lesion.  Genitourinary Comments: CLITORAL AREA WITH HYPERTROPHY AND EXCORIATIONS; NO SCALE/LESIONS  Neurological:  She is oriented to person, place, and time.  Vitals reviewed.   Results: Results for orders placed or performed in visit on 10/27/17 (from the past 24 hour(s))  POCT Wet Prep with KOH     Status: Normal   Collection Time: 10/27/17 11:15 AM  Result Value Ref Range   Trichomonas, UA Negative    Clue Cells Wet Prep HPF POC neg    Epithelial Wet Prep HPF POC  Few, Moderate, Many, Too numerous to count   Yeast Wet Prep HPF POC neg    Bacteria Wet Prep HPF POC  Few   RBC Wet Prep HPF POC     WBC Wet Prep HPF POC     KOH Prep POC Negative Negative     Assessment/Plan: Perimenopausal vasomotor symptoms - Given age of cessation of menses, check labs to confirm menopause. Discussed do nothing/HRT/SSRI or SNRIs. Pt unsure what she wants to do. - Plan: TSH + free T4, Follicle stimulating hormone, Prolactin  Amenorrhea - Plan: TSH + free T4, Follicle stimulating hormone, Prolactin  Screening for breast cancer - Pt to sched mammo. - Plan: MM DIGITAL SCREENING BILATERAL  Acute vaginitis - Neg wet prep, pos sx at clitoris. Looks like chronic irritation, quesiton itch-scratch. Rx lotrisone crm for 2 wks. F/u in 1 mo at annual. - Plan: clotrimazole-betamethasone (LOTRISONE) cream, POCT Wet Prep with KOH    Meds ordered this encounter  Medications  . clotrimazole-betamethasone (LOTRISONE) cream    Sig: Apply 1 application topically 2 (two) times daily. Apply externally BID for 2 wks    Dispense:  15 g    Refill:  0    Order Specific Question:   Supervising Provider    Answer:   Gae Dry [[id number]]      Return in about 4 weeks (around 11/18/863) for annual.  Alicia B. Copland, PA-C 10/27/2017 11:17 AM

## 2017-10-27 NOTE — Patient Instructions (Addendum)
I value your feedback and entrusting [place] with your care. If you get a [facility] patient survey, I would appreciate you taking the time to let us know about your experience today. Thank you!  [place] at [facility]: [place]: [phone]

## 2017-10-28 LAB — TSH+FREE T4
Free T4: 1.18 ng/dL (ref 0.82–1.77)
TSH: 2.26 u[IU]/mL (ref 0.450–4.500)

## 2017-10-28 LAB — FOLLICLE STIMULATING HORMONE: FSH: 86.3 m[IU]/mL

## 2017-10-28 LAB — PROLACTIN: Prolactin: 14.1 ng/mL (ref 4.8–23.3)

## 2017-11-29 ENCOUNTER — Ambulatory Visit (INDEPENDENT_AMBULATORY_CARE_PROVIDER_SITE_OTHER): Payer: BLUE CROSS/BLUE SHIELD | Admitting: Obstetrics and Gynecology

## 2017-11-29 ENCOUNTER — Encounter: Payer: Self-pay | Admitting: Obstetrics and Gynecology

## 2017-11-29 VITALS — BP 112/82 | HR 91 | Ht 63.0 in | Wt 223.0 lb

## 2017-11-29 DIAGNOSIS — Z131 Encounter for screening for diabetes mellitus: Secondary | ICD-10-CM

## 2017-11-29 DIAGNOSIS — Z1151 Encounter for screening for human papillomavirus (HPV): Secondary | ICD-10-CM

## 2017-11-29 DIAGNOSIS — Z13220 Encounter for screening for lipoid disorders: Secondary | ICD-10-CM | POA: Diagnosis not present

## 2017-11-29 DIAGNOSIS — Z124 Encounter for screening for malignant neoplasm of cervix: Secondary | ICD-10-CM | POA: Diagnosis not present

## 2017-11-29 DIAGNOSIS — Z0000 Encounter for general adult medical examination without abnormal findings: Secondary | ICD-10-CM | POA: Diagnosis not present

## 2017-11-29 DIAGNOSIS — Z1321 Encounter for screening for nutritional disorder: Secondary | ICD-10-CM | POA: Diagnosis not present

## 2017-11-29 DIAGNOSIS — N898 Other specified noninflammatory disorders of vagina: Secondary | ICD-10-CM

## 2017-11-29 DIAGNOSIS — Z1231 Encounter for screening mammogram for malignant neoplasm of breast: Secondary | ICD-10-CM

## 2017-11-29 DIAGNOSIS — N951 Menopausal and female climacteric states: Secondary | ICD-10-CM | POA: Insufficient documentation

## 2017-11-29 DIAGNOSIS — Z1239 Encounter for other screening for malignant neoplasm of breast: Secondary | ICD-10-CM

## 2017-11-29 DIAGNOSIS — Z01419 Encounter for gynecological examination (general) (routine) without abnormal findings: Secondary | ICD-10-CM | POA: Diagnosis not present

## 2017-11-29 NOTE — Progress Notes (Signed)
PCP:  Patient, No Pcp Per   Chief Complaint  Patient presents with  . Annual Exam     HPI:      [name] is a 47 y.o. G1P1001 who LMP was No LMP recorded. Patient is postmenopausal., presents today for her annual examination.  Her menses are absent due to menopause, confirmed with [place]. She does not have intermenstrual bleeding. She has occas vasomotor sx as well as difficulty sleeping. SSRIs/HRT discussed and pt undecided about any tx.   Sex activity: single partner, contraception - post menopausal status. No vag dryness. Vag itch/lesion from 1/19 resolved after lotrisone crm tx.  Last Pap: not recent Hx of STDs: none  Last mammogram: not recent; order placed 1/19 There is no FH of breast cancer. There is no FH of ovarian cancer. The patient does not do self-breast exams.  Tobacco use: The patient denies current or previous tobacco use. Alcohol use: none No drug use.  Exercise: not active  She does not get adequate calcium and Vitamin D in her diet.  No recent labs.   Past Medical History:  Diagnosis Date  . Obesity     Past Surgical History:  Procedure Laterality Date  . CESAREAN SECTION    . CHOLECYSTECTOMY    . GASTRIC BYPASS      Family History  Problem Relation Age of Onset  . Lung cancer Father     Social History   Socioeconomic History  . Marital status: Married    Spouse name: Not on file  . Number of children: Not on file  . Years of education: Not on file  . Highest education level: Not on file  Social Needs  . Financial resource strain: Not on file  . Food insecurity - worry: Not on file  . Food insecurity - inability: Not on file  . Transportation needs - medical: Not on file  . Transportation needs - non-medical: Not on file  Occupational History  . Not on file  Tobacco Use  . Smoking status: Never Smoker  . Smokeless tobacco: Never Used  Substance and Sexual Activity  . Alcohol use: No    Alcohol/week: 0.0 oz  . Drug use: No   . Sexual activity: Yes    Birth control/protection: Post-menopausal  Other Topics Concern  . Not on file  Social History Narrative  . Not on file    No outpatient medications have been marked as taking for the 11/29/17 encounter (Office Visit) with Copland, Deirdre Evener, PA-C.     ROS:  Review of Systems  Constitutional: Negative for fatigue, fever and unexpected weight change.  Respiratory: Negative for cough, shortness of breath and wheezing.   Cardiovascular: Negative for chest pain, palpitations and leg swelling.  Gastrointestinal: Negative for blood in stool, constipation, diarrhea, nausea and vomiting.  Endocrine: Negative for cold intolerance, heat intolerance and polyuria.  Genitourinary: Negative for dyspareunia, dysuria, flank pain, frequency, genital sores, hematuria, menstrual problem, pelvic pain, urgency, vaginal bleeding, vaginal discharge and vaginal pain.  Musculoskeletal: Negative for back pain, joint swelling and myalgias.  Skin: Negative for rash.  Neurological: Positive for headaches. Negative for dizziness, syncope, light-headedness and numbness.  Hematological: Negative for adenopathy.  Psychiatric/Behavioral: Negative for agitation, confusion, sleep disturbance and suicidal ideas. The patient is not nervous/anxious.      Objective: BP 112/82   Pulse 91   Ht 5' 3"  (1.6 m)   Wt 223 lb (101.2 kg)   BMI 39.50 kg/m  Physical Exam  Constitutional: She is oriented to person, place, and time. She appears well-developed and well-nourished.  Genitourinary: Vagina normal and uterus normal. There is no rash or tenderness on the right labia. There is no rash or tenderness on the left labia. No erythema or tenderness in the vagina. No vaginal discharge found. Right adnexum does not display mass and does not display tenderness. Left adnexum does not display mass and does not display tenderness. Cervix does not exhibit motion tenderness or polyp. Uterus is not enlarged  or tender.  Neck: Normal range of motion. No thyromegaly present.  Cardiovascular: Normal rate, regular rhythm and normal heart sounds.  No murmur heard. Pulmonary/Chest: Effort normal and breath sounds normal. Right breast exhibits no mass, no nipple discharge, no skin change and no tenderness. Left breast exhibits no mass, no nipple discharge, no skin change and no tenderness.  Abdominal: Soft. There is no tenderness. There is no guarding.  Musculoskeletal: Normal range of motion.  Neurological: She is alert and oriented to person, place, and time. No cranial nerve deficit.  Psychiatric: She has a normal mood and affect. Her behavior is normal.  Vitals reviewed.   Assessment/Plan: Encounter for annual routine gynecological examination  Cervical cancer screening - Plan: IGP, Aptima HPV  Screening for HPV (human papillomavirus) - Plan: IGP, Aptima HPV  Screening for breast cancer - Pt to sched mammo. Order placed 1/19  Vaginal itching - Resolved from 1/19 with lotrisone tx. F/u prn.   Menopausal symptom - Tolerable. pt to f/u prn tx.  Blood tests for routine general physical examination - Plan: Comprehensive metabolic panel, Lipid panel, Hemoglobin A1c, VITAMIN D 25 Hydroxy (Vit-D Deficiency, Fractures), B12 and Folate Panel  Screening cholesterol level - Plan: Lipid panel  Encounter for vitamin deficiency screening - Plan: VITAMIN D 25 Hydroxy (Vit-D Deficiency, Fractures), B12 and Folate Panel  Screening for diabetes mellitus - Plan: Hemoglobin A1c         GYN counsel breast self exam, mammography screening, menopause, adequate intake of calcium and vitamin D, diet and exercise     F/U  Return in about 1 year (around 11/29/2018).  Alicia B. Copland, PA-C 11/29/2017 9:26 AM

## 2017-11-29 NOTE — Patient Instructions (Signed)
I value your feedback and entrusting us with your care. If you get a [facility] patient survey, I would appreciate you taking the time to let us know about your experience today. Thank you! 

## 2017-12-01 LAB — IGP, APTIMA HPV
HPV Aptima: NEGATIVE
PAP SMEAR COMMENT: 0

## 2018-09-04 ENCOUNTER — Emergency Department
Admission: EM | Admit: 2018-09-04 | Discharge: 2018-09-04 | Disposition: A | Discharge status: Home / Self Care | Payer: BLUE CROSS/BLUE SHIELD | Attending: Emergency Medicine | Admitting: Emergency Medicine

## 2018-09-04 ENCOUNTER — Other Ambulatory Visit: Payer: Self-pay

## 2018-09-04 ENCOUNTER — Encounter: Payer: Self-pay | Admitting: Emergency Medicine

## 2018-09-04 DIAGNOSIS — R05 Cough: Secondary | ICD-10-CM | POA: Diagnosis present

## 2018-09-04 DIAGNOSIS — J101 Influenza due to other identified influenza virus with other respiratory manifestations: Secondary | ICD-10-CM

## 2018-09-04 LAB — INFLUENZA PANEL BY PCR (TYPE A & B)
INFLAPCR: POSITIVE — AB
INFLBPCR: NEGATIVE

## 2018-09-04 MED ORDER — ACETAMINOPHEN 325 MG PO TABS
650.0000 mg | ORAL_TABLET | Freq: Once | ORAL | Status: AC | PRN
  Administered 2018-09-04: 650 mg via ORAL
  Filled 2018-09-04: qty 2

## 2018-09-04 MED ORDER — OSELTAMIVIR PHOSPHATE 75 MG PO CAPS: 75.0000 mg | ORAL_CAPSULE | Freq: Two times a day (BID) | ORAL | 0 refills | Status: AC

## 2018-09-04 NOTE — Discharge Instructions (Addendum)
Follow-up with your primary care provider or [facility] acute care if any continued problems.  Tylenol or ibuprofen as needed for fever.  Stay hydrated with drinking lots of fluids.  Begin taking Tamiflu 1 twice daily until finished. Return to the ED if any worsening of your symptoms such as not being able to take the medication or inability to stay hydrated.

## 2018-09-04 NOTE — ED Triage Notes (Addendum)
Here for dry heaves, generalized body aches, cough, runny nose, sore throat, and headaches.  Reports her nephew was dx with the flu.  Pt has not checked her temperature.  Was carrying her mask when entered triage, requested she put it on.  Took advil at 0800 this morning.

## 2018-09-04 NOTE — ED Provider Notes (Signed)
[facility] Emergency Department Provider Note  ____________________________________________   First MD Initiated Contact with Patient 09/04/18 1350     (approximate)  I have reviewed the triage vital signs and the nursing notes.   HISTORY  Chief Complaint influenza symptoms   HPI Traci Taylor is a 47 y.o. female presents today with complaint of sudden onset of cough, congestion and fever 1-1/2 to 2  days ago.  Patient states that she is also had some dry heaves but no vomiting.  She was exposed to her nephew who was diagnosed with flu.  Patient last took Advil at 8 AM today.  Patient states that she did get the flu vaccine in October.  Rates her pain as 9 out of 10.   Past Medical History:  Diagnosis Date  . Obesity     Patient Active Problem List   Diagnosis Date Noted  . Menopausal symptom 11/29/2017    Past Surgical History:  Procedure Laterality Date  . CESAREAN SECTION    . CHOLECYSTECTOMY    . GASTRIC BYPASS      Prior to Admission medications   Medication Sig Start Date End Date Taking? Authorizing Provider  clotrimazole-betamethasone (LOTRISONE) cream Apply 1 application topically 2 (two) times daily. Apply externally BID for 2 wks Patient not taking: Reported on 11/29/2017 1/32/44   Copland, Deirdre Evener, PA-C  oseltamivir (TAMIFLU) 75 MG capsule Take 1 capsule (75 mg total) by mouth 2 (two) times daily for 5 days. 09/04/18 09/09/18  Johnn Hai, PA-C    Allergies Patient has no known allergies.  Family History  Problem Relation Age of Onset  . Lung cancer Father     Social History Social History   Tobacco Use  . Smoking status: Never Smoker  . Smokeless tobacco: Never Used  Substance Use Topics  . Alcohol use: No    Alcohol/week: 0.0 standard drinks  . Drug use: No    Review of Systems Constitutional: No fever/chills Eyes: No visual changes. ENT: Positive rhinorrhea and sore throat. Cardiovascular: Denies chest  pain. Respiratory: Denies shortness of breath. Gastrointestinal: No abdominal pain.  Mild nausea, no vomiting.  No diarrhea.   Genitourinary: Negative for dysuria. Musculoskeletal: Positive for muscle aches. Skin: Negative for rash. Neurological: Positive for headache.  No focal weakness or numbness. ___________________________________________   PHYSICAL EXAM:  VITAL SIGNS: ED Triage Vitals  Enc Vitals Group     BP 09/04/18 1339 (!) 138/95     Pulse Rate 09/04/18 1339 (!) 103     Resp 09/04/18 1339 20     Temp 09/04/18 1339 (!) 102.7 F (39.3 C)     Temp Source 09/04/18 1339 Oral     SpO2 09/04/18 1339 98 %     Weight 09/04/18 1337 200 lb (90.7 kg)     Height 09/04/18 1337 5' 4"  (1.626 m)     Head Circumference --      Peak Flow --      Pain Score 09/04/18 1337 9     Pain Loc --      Pain Edu? --      Excl. in [place]? --    Constitutional: Alert and oriented. Well appearing and in no acute distress. Eyes: Conjunctivae are normal. PERRL. EOMI. Head: Atraumatic. Nose: Mild congestion/rhinnorhea. Mouth/Throat: Mucous membranes are moist.  Oropharynx non-erythematous. Neck: No stridor.   Hematological/Lymphatic/Immunilogical: No cervical lymphadenopathy. Cardiovascular: Normal rate, regular rhythm. Grossly normal heart sounds.  Good peripheral circulation. Respiratory: Normal respiratory effort.  No retractions. Lungs CTAB. Musculoskeletal: His upper and lower extremities that any difficulty.  Normal gait was noted. Neurologic:  Normal speech and language. No gross focal neurologic deficits are appreciated. No gait instability. Skin:  Skin is warm, dry and intact. No rash noted. Psychiatric: Mood and affect are normal. Speech and behavior are normal.  ____________________________________________   LABS (all labs ordered are listed, but only abnormal results are displayed)  Labs Reviewed  INFLUENZA PANEL BY PCR (TYPE A & B) - Abnormal; Notable for the following components:        Result Value   Influenza A By PCR POSITIVE (*)    All other components within normal limits    PROCEDURES  Procedure(s) performed: None  Procedures  Critical Care performed: No  ____________________________________________   INITIAL IMPRESSION / ASSESSMENT AND PLAN / ED COURSE  As part of my medical decision making, I reviewed the following data within the electronic MEDICAL RECORD NUMBER Notes from prior ED visits and [place] Controlled Substance Database  Patient presents to the ED with sudden onset of generalized body aches, cough, rhinorrhea and sore throat with headache.  Patient was exposed to influenza.  She states that she got a flu vaccine in October.  She has been taking over-the-counter medication with minimal improvement.  Physical exam is benign however influenza test was positive for influenza A.  patient was made aware.  She was given a prescription for Tamiflu and encouraged to drink fluids and take over-the-counter medication to control her temperature.  Vital signs were improved prior to discharge.  ____________________________________________   FINAL CLINICAL IMPRESSION(S) / ED DIAGNOSES  Final diagnoses:  Influenza A     ED Discharge Orders         Ordered    oseltamivir (TAMIFLU) 75 MG capsule  2 times daily     09/04/18 1452           Note:  This document was prepared using Dragon voice recognition software and may include unintentional dictation errors.    Johnn Hai, PA-C 09/04/18 1508    Harvest Dark, MD 09/04/18 [phone]

## 2020-08-19 DIAGNOSIS — I10 Essential (primary) hypertension: Secondary | ICD-10-CM | POA: Insufficient documentation

## 2020-08-19 DIAGNOSIS — Z9884 Bariatric surgery status: Secondary | ICD-10-CM | POA: Insufficient documentation

## 2020-09-26 LAB — EXTERNAL GENERIC LAB PROCEDURE: COLOGUARD: NEGATIVE

## 2021-04-25 ENCOUNTER — Other Ambulatory Visit (HOSPITAL_COMMUNITY)
Admission: RE | Admit: 2021-04-25 | Discharge: 2021-04-25 | Disposition: A | Discharge status: Home / Self Care | Payer: BLUE CROSS/BLUE SHIELD | Source: Ambulatory Visit | Attending: Advanced Practice Midwife | Admitting: Advanced Practice Midwife

## 2021-04-25 ENCOUNTER — Encounter: Payer: Self-pay | Admitting: Advanced Practice Midwife

## 2021-04-25 ENCOUNTER — Other Ambulatory Visit: Payer: Self-pay

## 2021-04-25 ENCOUNTER — Ambulatory Visit (INDEPENDENT_AMBULATORY_CARE_PROVIDER_SITE_OTHER): Payer: 59 | Admitting: Advanced Practice Midwife

## 2021-04-25 VITALS — BP 124/76 | HR 91 | Ht 64.0 in | Wt 200.6 lb

## 2021-04-25 DIAGNOSIS — B9689 Other specified bacterial agents as the cause of diseases classified elsewhere: Secondary | ICD-10-CM

## 2021-04-25 DIAGNOSIS — Z1159 Encounter for screening for other viral diseases: Secondary | ICD-10-CM | POA: Diagnosis not present

## 2021-04-25 DIAGNOSIS — Z124 Encounter for screening for malignant neoplasm of cervix: Secondary | ICD-10-CM | POA: Diagnosis not present

## 2021-04-25 DIAGNOSIS — N898 Other specified noninflammatory disorders of vagina: Secondary | ICD-10-CM | POA: Diagnosis not present

## 2021-04-25 DIAGNOSIS — Z113 Encounter for screening for infections with a predominantly sexual mode of transmission: Secondary | ICD-10-CM | POA: Diagnosis not present

## 2021-04-25 DIAGNOSIS — B379 Candidiasis, unspecified: Secondary | ICD-10-CM

## 2021-04-25 DIAGNOSIS — N760 Acute vaginitis: Secondary | ICD-10-CM

## 2021-04-25 NOTE — Progress Notes (Addendum)
Patient ID: Traci Taylor, female   DOB: [birth year], 50 y.o.   MRN: [id number]  Reason for Consult: Vaginal Discharge (Clear discharge, fishy and foul odor. Unsure if she was exposed to STD x 2 day /)    Subjective:  HPI:  SALEM Taylor is a 50 y.o. female being seen for thin white vaginal discharge and fishy odor for the past week. She has concern for possible exposure to STD due to symptoms. She admits new partner and no condom use. She denies itching or irritation. Her last PAP smear was 3.5 years ago and was normal. She prefers metronidazole gel if lab is positive for BV. Discussed possible yeast infection following if she needs to treat BV. Rx for diflucan will also be sent in that event. She requests Vaginitis/STD (all) testing/PAP smear today.  Past Medical History:  Diagnosis Date   Obesity    Family History  Problem Relation Age of Onset   Lung cancer Father    Past Surgical History:  Procedure Laterality Date   CESAREAN SECTION     CHOLECYSTECTOMY     GASTRIC BYPASS      Short Social History:  Social History   Tobacco Use   Smoking status: Never   Smokeless tobacco: Never  Substance Use Topics   Alcohol use: No    Alcohol/week: 0.0 standard drinks    No Known Allergies  Current Outpatient Medications  Medication Sig Dispense Refill   cyanocobalamin (,VITAMIN B-12,) 1000 MCG/ML injection Inject into the muscle.     hydrochlorothiazide (HYDRODIURIL) 12.5 MG tablet Take by mouth.     ibuprofen (ADVIL) 200 MG tablet Take by mouth.     No current facility-administered medications for this visit.   Review of Systems  Constitutional:  Negative for chills and fever.  HENT:  Negative for congestion, ear discharge, ear pain, hearing loss, sinus pain and sore throat.   Eyes:  Negative for blurred vision and double vision.  Respiratory:  Negative for cough, shortness of breath and wheezing.   Cardiovascular:  Negative for chest pain, palpitations and leg swelling.   Gastrointestinal:  Negative for abdominal pain, blood in stool, constipation, diarrhea, heartburn, melena, nausea and vomiting.  Genitourinary:  Negative for dysuria, flank pain, frequency, hematuria and urgency.       Positive for vaginal discharge and odor  Musculoskeletal:  Negative for back pain, joint pain and myalgias.  Skin:  Negative for itching and rash.  Neurological:  Negative for dizziness, tingling, tremors, sensory change, speech change, focal weakness, seizures, loss of consciousness, weakness and headaches.  Endo/Heme/Allergies:  Negative for environmental allergies. Does not bruise/bleed easily.  Psychiatric/Behavioral:  Negative for depression, hallucinations, memory loss, substance abuse and suicidal ideas. The patient is not nervous/anxious and does not have insomnia.        Objective:  Objective   Vitals:   04/25/21 0919  BP: 124/76  Pulse: 91  SpO2: 98%  Weight: 200 lb 9.6 oz (91 kg)  Height: 5' 4"  (1.626 m)   Body mass index is 34.43 kg/m. Constitutional: Well nourished, well developed female in no acute distress.  HEENT: normal Skin: Warm and dry.  Cardiovascular: Regular rate and rhythm.   Extremity:  no edema   Respiratory: Clear to auscultation bilateral. Normal respiratory effort Neuro: DTRs 2+, Cranial nerves grossly intact Psych: Alert and Oriented x3. No memory deficits. Normal mood and affect.  MS: normal gait, normal bilateral lower extremity ROM/strength/stability.  Pelvic exam:  is not limited by  body habitus EGBUS: within normal limits Vagina: within normal limits and with normal mucosa, viscous white discharge Cervix: os tightly closed, friable  Update to visit Results for HONESTII, MARTON (MRN [id number]) as of 04/29/2021 13:37  Ref. Range 04/25/2021 09:37 04/25/2021 09:37 04/25/2021 09:54  Chlamydia Unknown Negative    Neisseria Gonorrhea Unknown Negative    RPR Latest Ref Range: Non Reactive    Non Reactive  Trichomonas Unknown Negative     Candida Vaginitis Unknown Negative    Candida Glabrata Unknown Negative    Bacterial Vaginitis (gardnerella) Unknown Positive (A)    High risk HPV Unknown Negative    Hep B Surface Ab, Qual Unknown   Non Reactive  Hep C Virus Ab Latest Ref Range: 0.0 - 0.9 s/co ratio   0.2  HIV Screen 4th Generation wRfx Latest Ref Range: Non Reactive    Non Reactive  CYTOLOGY - PAP Unknown Rpt    Adequacy Unknown Satisfactory for evaluation; transformation zone component PRESENT.    Diagnosis: Unknown - Benign reactive/reparative changes - Negative for Intraepithelial Lesions or Malignancy (NILM)   CERVICOVAGINAL ANCILLARY ONLY Unknown Rpt (A)      Assessment/Plan:     50 y.o. (G1 P1 via c/s) female cervical cancer screening, STD/vaginitis screen  PAPtima Nuswab Follow up after labs result: Pt prefers metronidazole gel/diflucan if treating for BV STD prevention: condoms  Rx [facility] gel Rx [place] 04/25/2021, 9:57 AM

## 2021-04-26 LAB — HEPATITIS B SURFACE ANTIBODY,QUALITATIVE: Hep B Surface Ab, Qual: NONREACTIVE

## 2021-04-26 LAB — HIV ANTIBODY (ROUTINE TESTING W REFLEX): HIV Screen 4th Generation wRfx: NONREACTIVE

## 2021-04-26 LAB — HEPATITIS C ANTIBODY: Hep C Virus Ab: 0.2 s/co ratio (ref 0.0–0.9)

## 2021-04-26 LAB — RPR QUALITATIVE: RPR Ser Ql: NONREACTIVE

## 2021-04-28 ENCOUNTER — Telehealth: Payer: Self-pay

## 2021-04-28 LAB — CERVICOVAGINAL ANCILLARY ONLY
Bacterial Vaginitis (gardnerella): POSITIVE — AB
Candida Glabrata: NEGATIVE
Candida Vaginitis: NEGATIVE
Comment: NEGATIVE
Comment: NEGATIVE
Comment: NEGATIVE

## 2021-04-28 NOTE — Telephone Encounter (Signed)
Pt calling for test results.  [phone]  pt aware of normal blood results; pap and aptima not back yet.

## 2021-04-29 LAB — CYTOLOGY - PAP
Chlamydia: NEGATIVE
Comment: NEGATIVE
Comment: NEGATIVE
Comment: NEGATIVE
Comment: NORMAL
Diagnosis: NEGATIVE
Diagnosis: REACTIVE
High risk HPV: NEGATIVE
Neisseria Gonorrhea: NEGATIVE
Trichomonas: NEGATIVE

## 2021-04-29 MED ORDER — METRONIDAZOLE 0.75 % VA GEL: 1.0000 | Freq: Every day | VAGINAL | 1 refills | Status: AC

## 2021-04-29 MED ORDER — FLUCONAZOLE 150 MG PO TABS: 150.0000 mg | ORAL_TABLET | Freq: Once | ORAL | 3 refills | Status: AC

## 2021-04-29 NOTE — Addendum Note (Signed)
Addended by: Rod Can on: 04/29/2021 01:40 PM   Modules accepted: Orders

## 2021-05-08 ENCOUNTER — Telehealth: Payer: Self-pay

## 2021-05-08 NOTE — Telephone Encounter (Signed)
Pt calling; still has the same sxs she had on 04/25/21 appt.  [phone]

## 2021-05-12 NOTE — Telephone Encounter (Signed)
Spoke with patient and she reports symptoms are now resolved.

## 2022-03-10 ENCOUNTER — Ambulatory Visit
Admission: EM | Admit: 2022-03-10 | Discharge: 2022-03-10 | Disposition: A | Discharge status: Home / Self Care | Payer: 59 | Attending: Physician Assistant | Admitting: Physician Assistant

## 2022-03-10 ENCOUNTER — Encounter: Payer: Self-pay | Admitting: Emergency Medicine

## 2022-03-10 DIAGNOSIS — N390 Urinary tract infection, site not specified: Secondary | ICD-10-CM | POA: Diagnosis not present

## 2022-03-10 DIAGNOSIS — N3090 Cystitis, unspecified without hematuria: Secondary | ICD-10-CM | POA: Insufficient documentation

## 2022-03-10 LAB — URINALYSIS, MICROSCOPIC (REFLEX): WBC, UA: >50 WBC/hpf (ref 0–5)

## 2022-03-10 LAB — URINALYSIS, ROUTINE W REFLEX MICROSCOPIC
Bilirubin Urine: NEGATIVE
Glucose, UA: NEGATIVE mg/dL
Ketones, ur: NEGATIVE mg/dL
Nitrite: POSITIVE — AB
Protein, ur: NEGATIVE mg/dL
Specific Gravity, Urine: <1.005 — ABNORMAL LOW (ref 1.005–1.030)
pH: 5.5 (ref 5.0–8.0)

## 2022-03-10 MED ORDER — CIPROFLOXACIN HCL 500 MG PO TABS: 500.0000 mg | ORAL_TABLET | Freq: Two times a day (BID) | ORAL | 0 refills | Status: DC

## 2022-03-10 NOTE — ED Provider Notes (Signed)
[facility]    CSN: [id number] Arrival date & time: 03/10/22  1658      History   Chief Complaint Chief Complaint  Patient presents with   Urinary Frequency    HPI Traci Taylor is a 51 y.o. female.   Patient is a 51 year old female who presents with chief complaint of right toe pain and urinary symptoms.  Patient states approximately 3-4 weeks ago she kicked a dumbbell with her right foot.  She reports the pain to the middle toe of the right foot is still swollen, bruised, and painful.  She reports she has been buddy taping it to the toe next to it.  In regards to her urinary symptoms, patient reports frequency pain towards the end of urination and some burning.  She has been taken Azo over-the-counter for pain.  Reports symptoms started 3 to 4 days ago.  Patient denies any allergies to medications.    Past Medical History:  Diagnosis Date   Obesity     Patient Active Problem List   Diagnosis Date Noted   Essential hypertension 08/19/2020   History of gastric bypass 08/19/2020   Menopausal symptom 11/29/2017    Past Surgical History:  Procedure Laterality Date   CESAREAN SECTION     CHOLECYSTECTOMY     GASTRIC BYPASS      OB History     Gravida  1   Para  1   Term  1   Preterm      AB      Living  1      SAB      IAB      Ectopic      Multiple      Live Births  1            Home Medications    Prior to Admission medications   Medication Sig Start Date End Date Taking? Authorizing Provider  ciprofloxacin (CIPRO) 500 MG tablet Take 1 tablet (500 mg total) by mouth 2 (two) times daily. 03/10/22  Yes Luvenia Redden, PA-C  cyanocobalamin (,VITAMIN B-12,) 1000 MCG/ML injection Inject into the muscle. 10/02/19   [provider]  hydrochlorothiazide (HYDRODIURIL) 12.5 MG tablet Take by mouth. 03/12/20   [provider]  ibuprofen (ADVIL) 200 MG tablet Take by mouth.    [provider]    Family  History Family History  Problem Relation Age of Onset   Lung cancer Father     Social History Social History   Tobacco Use   Smoking status: Never   Smokeless tobacco: Never  Vaping Use   Vaping Use: Never used  Substance Use Topics   Alcohol use: No    Alcohol/week: 0.0 standard drinks   Drug use: No     Allergies   Patient has no known allergies.   Review of Systems Review of Systems as noted above in HPI.  Other systems reviewed and found to be negative   Physical Exam Triage Vital Signs ED Triage Vitals  Enc Vitals Group     BP 03/10/22 1721 (!) 137/92     Pulse Rate 03/10/22 1721 68     Resp 03/10/22 1721 18     Temp 03/10/22 1721 98.8 F (37.1 C)     Temp Source 03/10/22 1721 Oral     SpO2 03/10/22 1721 100 %     Weight --      Height 03/10/22 1723 5' 3"  (1.6 m)     Head  Circumference --      Peak Flow --      Pain Score 03/10/22 1723 6     Pain Loc --      Pain Edu? --      Excl. in [place]? --    No data found.  Updated Vital Signs BP (!) 137/92 (BP Location: Left Arm)   Pulse 68   Temp 98.8 F (37.1 C) (Oral)   Resp 18   Ht 5' 3"  (1.6 m)   SpO2 100%   BMI 35.53 kg/m     Physical Exam Constitutional:      General: She is not in acute distress.    Appearance: Normal appearance. She is not ill-appearing.  Cardiovascular:     Rate and Rhythm: Normal rate and regular rhythm.  Abdominal:     Palpations: Abdomen is soft.     Tenderness: There is right CVA tenderness. There is no left CVA tenderness.  Musculoskeletal:       Feet:  Skin:    General: Skin is warm and dry.  Neurological:     General: No focal deficit present.     Mental Status: She is alert and oriented to person, place, and time.     UC Treatments / Results  Labs (all labs ordered are listed, but only abnormal results are displayed) Labs Reviewed  URINALYSIS, ROUTINE W REFLEX MICROSCOPIC - Abnormal; Notable for the following components:      Result Value   APPearance  HAZY (*)    Specific Gravity, Urine <1.005 (*)    Hgb urine dipstick SMALL (*)    Nitrite POSITIVE (*)    Leukocytes,Ua LARGE (*)    All other components within normal limits  URINALYSIS, MICROSCOPIC (REFLEX) - Abnormal; Notable for the following components:   Bacteria, UA MANY (*)    Non Squamous Epithelial PRESENT (*)    All other components within normal limits  URINE CULTURE    EKG   Radiology No results found.  Procedures Procedures (including critical care time)  Medications Ordered in UC Medications - No data to display  Initial Impression / Assessment and Plan / UC Course  I have reviewed the triage vital signs and the nursing notes.  Pertinent labs & imaging results that were available during my care of the patient were reviewed by me and considered in my medical decision making (see chart for details).    In regards to the foot, swelling and tenderness to the third toe of the right foot.  Injury about 3-4 weeks ago.  Recommend patient to continue with buddy taping and anti-inflammatories.  X-ray at this point would not really change management.  Have her follow-up with podiatry or orthopedics should her symptoms not improve within the a week or so.  Urine with positive nitrites and many bacteria.  Patient symptomology also likely with cystitis and concern for some pyelonephritis with tenderness to the right flank.  Give her prescriptions for Cipro and send urine for culture. Final Clinical Impressions(s) / UC Diagnoses   Final diagnoses:  Cystitis  Urinary tract infection without hematuria, site unspecified     Discharge Instructions      -Cipro: 1 tablet twice a day for 7 days -Can use over-the-counter medications for pain and discomfort -We will continue buddy tape for toe injury.  Again can use over-the-counter anti-inflammatories for pain.  Recommend firmer bottom shoe.  Follow-up with podiatry or orthopedics if no improvement within a couple  weeks.  ED Prescriptions     Medication Sig Dispense Auth. Provider   ciprofloxacin (CIPRO) 500 MG tablet Take 1 tablet (500 mg total) by mouth 2 (two) times daily. 14 tablet Luvenia Redden, PA-C      PDMP not reviewed this encounter.  Total patient time 25 minutes, this includes both face-to-face and non-face-to-face time.     Luvenia Redden, PA-C 03/10/22 1800

## 2022-03-10 NOTE — Discharge Instructions (Addendum)
-  Cipro: 1 tablet twice a day for 7 days -Can use over-the-counter medications for pain and discomfort -We will continue buddy tape for toe injury.  Again can use over-the-counter anti-inflammatories for pain.  Recommend firmer bottom shoe.  Follow-up with podiatry or orthopedics if no improvement within a couple weeks.

## 2022-03-10 NOTE — ED Triage Notes (Signed)
Patient c/o urinary frequency and dysuria x 3-4 days.  Patient has taken OTC AZO for pain.  Patient also has stumped her right 3rd toe about 3 weeks ago.  Still having pain.  Patient has taken Advil.

## 2022-03-12 LAB — URINE CULTURE: Culture: >=100000 — AB

## 2022-11-03 ENCOUNTER — Ambulatory Visit
Admission: EM | Admit: 2022-11-03 | Discharge: 2022-11-03 | Disposition: A | Discharge status: Home / Self Care | Payer: BC Managed Care – PPO | Attending: Physician Assistant | Admitting: Physician Assistant

## 2022-11-03 ENCOUNTER — Encounter: Payer: Self-pay | Admitting: Emergency Medicine

## 2022-11-03 DIAGNOSIS — R21 Rash and other nonspecific skin eruption: Secondary | ICD-10-CM

## 2022-11-03 DIAGNOSIS — R220 Localized swelling, mass and lump, head: Secondary | ICD-10-CM | POA: Diagnosis not present

## 2022-11-03 MED ORDER — METHYLPREDNISOLONE 4 MG PO TBPK: ORAL_TABLET | ORAL | 0 refills | Status: DC

## 2022-11-03 NOTE — ED Triage Notes (Signed)
Pt presents with a rash on her face she noticed yesterday and her nose is swollen.

## 2022-11-03 NOTE — Discharge Instructions (Addendum)
-  I have sent corticosteroids to pharmacy to help with the rash and facial swelling.  You should also apply ice to your face and take antihistamines such as nondrowsy Claritin during the day and Benadryl at evening. - If your swelling or rash worsen, go to ER.  For any oral swelling or breathing difficulty, call 911 or go to ER.

## 2022-11-03 NOTE — ED Provider Notes (Signed)
[facility]    CSN: [id number] Arrival date & time: 11/03/22  0847      History   Chief Complaint Chief Complaint  Patient presents with   Rash    HPI Traci Taylor is a 52 y.o. female presenting for rash of bilateral cheeks and nose worsening since yesterday.  Patient reports she was around some chemicals that were sprayed to clean an area.  She says she thinks it landed on the tissue that she then placed on her face and blew her nose with.  She says ever since then she has noticed the rash and swelling.  She denies any known allergies.  She has not taken any medication to help with her symptoms including antihistamines.  She not reporting any difficulty swallowing, oral swelling or difficulty breathing.  She has not been ill.  No other concerns.  HPI  Past Medical History:  Diagnosis Date   Obesity     Patient Active Problem List   Diagnosis Date Noted   Essential hypertension 08/19/2020   History of gastric bypass 08/19/2020   Menopausal symptom 11/29/2017    Past Surgical History:  Procedure Laterality Date   CESAREAN SECTION     CHOLECYSTECTOMY     GASTRIC BYPASS      OB History     Gravida  1   Para  1   Term  1   Preterm      AB      Living  1      SAB      IAB      Ectopic      Multiple      Live Births  1            Home Medications    Prior to Admission medications   Medication Sig Start Date End Date Taking? Authorizing Provider  methylPREDNISolone (MEDROL DOSEPAK) 4 MG TBPK tablet Take according to Dosepak instructions 11/03/22  Yes Danton Clap, PA-C  ciprofloxacin (CIPRO) 500 MG tablet Take 1 tablet (500 mg total) by mouth 2 (two) times daily. 03/10/22   Luvenia Redden, PA-C  cyanocobalamin (,VITAMIN B-12,) 1000 MCG/ML injection Inject into the muscle. 10/02/19   [provider]  hydrochlorothiazide (HYDRODIURIL) 12.5 MG tablet Take by mouth. 03/12/20   [provider]  ibuprofen (ADVIL) 200 MG  tablet Take by mouth.    [provider]    Family History Family History  Problem Relation Age of Onset   Lung cancer Father     Social History Social History   Tobacco Use   Smoking status: Never   Smokeless tobacco: Never  Vaping Use   Vaping Use: Never used  Substance Use Topics   Alcohol use: No    Alcohol/week: 0.0 standard drinks of alcohol   Drug use: No     Allergies   Patient has no known allergies.   Review of Systems Review of Systems  Constitutional:  Negative for fatigue and fever.  HENT:  Positive for facial swelling. Negative for congestion, trouble swallowing and voice change.   Respiratory:  Negative for chest tightness, shortness of breath and wheezing.   Skin:  Positive for rash.  Neurological:  Negative for dizziness and headaches.     Physical Exam Triage Vital Signs ED Triage Vitals  Enc Vitals Group     BP      Pulse      Resp      Temp  Temp src      SpO2      Weight      Height      Head Circumference      Peak Flow      Pain Score      Pain Loc      Pain Edu?      Excl. in [place]?    No data found.  Updated Vital Signs BP (!) 141/98 (BP Location: Left Arm)   Pulse 69   Temp 98.4 F (36.9 C) (Oral)   Resp 18   SpO2 100%      Physical Exam Vitals and nursing note reviewed.  Constitutional:      General: She is not in acute distress.    Appearance: Normal appearance. She is not ill-appearing or toxic-appearing.  HENT:     Head: Normocephalic and atraumatic.     Nose: Congestion present.     Mouth/Throat:     Mouth: Mucous membranes are moist.     Pharynx: Oropharynx is clear.  Eyes:     General: No scleral icterus.       Right eye: No discharge.        Left eye: No discharge.     Conjunctiva/sclera: Conjunctivae normal.  Cardiovascular:     Rate and Rhythm: Normal rate and regular rhythm.     Heart sounds: Normal heart sounds.  Pulmonary:     Effort: Pulmonary effort is normal. No respiratory  distress.     Breath sounds: Normal breath sounds.  Musculoskeletal:     Cervical back: Neck supple.  Skin:    General: Skin is dry.     Findings: Rash (erythema bilateral maxillary and  nose. No TTP. No lesions) present.  Neurological:     General: No focal deficit present.     Mental Status: She is alert. Mental status is at baseline.     Motor: No weakness.     Gait: Gait normal.  Psychiatric:        Mood and Affect: Mood normal.        Behavior: Behavior normal.        Thought Content: Thought content normal.      UC Treatments / Results  Labs (all labs ordered are listed, but only abnormal results are displayed) Labs Reviewed - No data to display  EKG   Radiology No results found.  Procedures Procedures (including critical care time)  Medications Ordered in UC Medications - No data to display  Initial Impression / Assessment and Plan / UC Course  I have reviewed the triage vital signs and the nursing notes.  Pertinent labs & imaging results that were available during my care of the patient were reviewed by me and considered in my medical decision making (see chart for details).   52 year old female presents for swelling of the face and nose as well as erythematous rash.  Symptoms started yesterday.  She states she was exposed to cleaning chemicals which laying on a Kleenex that she then needs to blow her nose with.  Has not had any oral swelling or breathing difficulty.  No known allergies.  No change to soaps, detergents, medications, foods.  Patient is in no acute distress.  She has mild swelling of her nose and maxillary region with erythema.  No lesions.  Mild nasal congestion without drainage.  Area nontender.  No oral swelling or lip swelling.  Chest clear to auscultation.  Likely unknown contact dermatitis.  Treating with Medrol Dosepak.  Also advised use of antihistamines and cool compresses.  Reviewed return and ER precautions.  Final Clinical  Impressions(s) / UC Diagnoses   Final diagnoses:  Rash  Facial swelling     Discharge Instructions      -I have sent corticosteroids to pharmacy to help with the rash and facial swelling.  You should also apply ice to your face and take antihistamines such as nondrowsy Claritin during the day and Benadryl at evening. - If your swelling or rash worsen, go to ER.  For any oral swelling or breathing difficulty, call 911 or go to ER.     ED Prescriptions     Medication Sig Dispense Auth. Provider   methylPREDNISolone (MEDROL DOSEPAK) 4 MG TBPK tablet Take according to Dosepak instructions 21 tablet Danton Clap, PA-C      PDMP not reviewed this encounter.   Danton Clap, PA-C 11/03/22 1020

## 2022-11-17 ENCOUNTER — Other Ambulatory Visit: Payer: Self-pay

## 2022-11-17 DIAGNOSIS — Z1231 Encounter for screening mammogram for malignant neoplasm of breast: Secondary | ICD-10-CM

## 2023-03-08 ENCOUNTER — Encounter: Payer: Self-pay | Admitting: Emergency Medicine

## 2023-03-08 ENCOUNTER — Ambulatory Visit
Admission: EM | Admit: 2023-03-08 | Discharge: 2023-03-08 | Disposition: A | Discharge status: Home / Self Care | Payer: BC Managed Care – PPO | Attending: Physician Assistant | Admitting: Physician Assistant

## 2023-03-08 DIAGNOSIS — N3000 Acute cystitis without hematuria: Secondary | ICD-10-CM | POA: Diagnosis present

## 2023-03-08 DIAGNOSIS — B3731 Acute candidiasis of vulva and vagina: Secondary | ICD-10-CM | POA: Insufficient documentation

## 2023-03-08 DIAGNOSIS — R300 Dysuria: Secondary | ICD-10-CM | POA: Diagnosis present

## 2023-03-08 DIAGNOSIS — R350 Frequency of micturition: Secondary | ICD-10-CM | POA: Insufficient documentation

## 2023-03-08 LAB — URINALYSIS, W/ REFLEX TO CULTURE (INFECTION SUSPECTED)
Glucose, UA: NEGATIVE mg/dL
Nitrite: NEGATIVE
Protein, ur: 30 mg/dL — AB
Specific Gravity, Urine: >1.03 — ABNORMAL HIGH (ref 1.005–1.030)
pH: 5.5 (ref 5.0–8.0)

## 2023-03-08 MED ORDER — FLUCONAZOLE 150 MG PO TABS: 150.0000 mg | ORAL_TABLET | Freq: Every day | ORAL | 0 refills | Status: AC

## 2023-03-08 MED ORDER — NITROFURANTOIN MONOHYD MACRO 100 MG PO CAPS: 100.0000 mg | ORAL_CAPSULE | Freq: Two times a day (BID) | ORAL | 0 refills | Status: DC

## 2023-03-08 MED ORDER — PHENAZOPYRIDINE HCL 200 MG PO TABS: 200.0000 mg | ORAL_TABLET | Freq: Three times a day (TID) | ORAL | 0 refills | Status: DC

## 2023-03-08 NOTE — ED Triage Notes (Signed)
Pt c/o dysuria and urinary frequency x 5-6 days.

## 2023-03-08 NOTE — ED Provider Notes (Signed)
[facility]    CSN: [id number] Arrival date & time: 03/08/23  1738      History   Chief Complaint Chief Complaint  Patient presents with   Dysuria   Urinary Frequency    HPI Traci Taylor is a 52 y.o. female presenting for 5 to 6-day history of dysuria, frequency and urgency.  Denies fever, fatigue, chills, sweats, flank pain, nausea/vomiting, pelvic pain, vaginal discharge/itching or odor.  Reports history of UTIs and feels like this is UTI.  No recent medications taken for symptoms.  No other complaints.  HPI  Past Medical History:  Diagnosis Date   Obesity     Patient Active Problem List   Diagnosis Date Noted   Essential hypertension 08/19/2020   History of gastric bypass 08/19/2020   Menopausal symptom 11/29/2017    Past Surgical History:  Procedure Laterality Date   CESAREAN SECTION     CHOLECYSTECTOMY     GASTRIC BYPASS      OB History     Gravida  1   Para  1   Term  1   Preterm      AB      Living  1      SAB      IAB      Ectopic      Multiple      Live Births  1            Home Medications    Prior to Admission medications   Medication Sig Start Date End Date Taking? Authorizing Provider  fluconazole (DIFLUCAN) 150 MG tablet Take 1 tablet (150 mg total) by mouth daily for 1 day. 03/08/23 03/09/23 Yes Shirlee Latch, PA-C  nitrofurantoin, macrocrystal-monohydrate, (MACROBID) 100 MG capsule Take 1 capsule (100 mg total) by mouth 2 (two) times daily. 03/08/23  Yes Shirlee Latch, PA-C  phenazopyridine (PYRIDIUM) 200 MG tablet Take 1 tablet (200 mg total) by mouth 3 (three) times daily. 03/08/23  Yes Eusebio Friendly B, PA-C  cyanocobalamin (,VITAMIN B-12,) 1000 MCG/ML injection Inject into the muscle. 10/02/19   [provider]  hydrochlorothiazide (HYDRODIURIL) 12.5 MG tablet Take by mouth. 03/12/20   [provider]  ibuprofen (ADVIL) 200 MG tablet Take by mouth.    [provider]    Family  History Family History  Problem Relation Age of Onset   Lung cancer Father     Social History Social History   Tobacco Use   Smoking status: Never   Smokeless tobacco: Never  Vaping Use   Vaping Use: Never used  Substance Use Topics   Alcohol use: No    Alcohol/week: 0.0 standard drinks of alcohol   Drug use: No     Allergies   Patient has no known allergies.   Review of Systems Review of Systems  Constitutional:  Negative for chills, fatigue and fever.  Gastrointestinal:  Negative for abdominal pain, diarrhea, nausea and vomiting.  Genitourinary:  Positive for dysuria, frequency and urgency. Negative for decreased urine volume, flank pain, hematuria, pelvic pain, vaginal bleeding, vaginal discharge and vaginal pain.  Musculoskeletal:  Negative for back pain.  Skin:  Negative for rash.     Physical Exam Triage Vital Signs ED Triage Vitals  Enc Vitals Group     BP      Pulse      Resp      Temp      Temp src      SpO2  Weight      Height      Head Circumference      Peak Flow      Pain Score      Pain Loc      Pain Edu?      Excl. in [place]?    No data found.  Updated Vital Signs BP (!) 165/93 (BP Location: Left Arm)   Pulse 72   Temp 98.5 F (36.9 C) (Oral)   Resp 18   SpO2 97%      Physical Exam Vitals and nursing note reviewed.  Constitutional:      General: She is not in acute distress.    Appearance: Normal appearance. She is not ill-appearing or toxic-appearing.  HENT:     Head: Normocephalic and atraumatic.  Eyes:     General: No scleral icterus.       Right eye: No discharge.        Left eye: No discharge.     Conjunctiva/sclera: Conjunctivae normal.  Cardiovascular:     Rate and Rhythm: Normal rate and regular rhythm.     Heart sounds: Normal heart sounds.  Pulmonary:     Effort: Pulmonary effort is normal. No respiratory distress.     Breath sounds: Normal breath sounds.  Abdominal:     Palpations: Abdomen is soft.      Tenderness: There is no abdominal tenderness. There is no right CVA tenderness or left CVA tenderness.  Musculoskeletal:     Cervical back: Neck supple.  Skin:    General: Skin is dry.  Neurological:     General: No focal deficit present.     Mental Status: She is alert. Mental status is at baseline.     Motor: No weakness.     Gait: Gait normal.  Psychiatric:        Mood and Affect: Mood normal.        Behavior: Behavior normal.        Thought Content: Thought content normal.      UC Treatments / Results  Labs (all labs ordered are listed, but only abnormal results are displayed) Labs Reviewed  URINALYSIS, W/ REFLEX TO CULTURE (INFECTION SUSPECTED) - Abnormal; Notable for the following components:      Result Value   APPearance HAZY (*)    Specific Gravity, Urine >1.030 (*)    Hgb urine dipstick MODERATE (*)    Bilirubin Urine SMALL (*)    Ketones, ur TRACE (*)    Protein, ur 30 (*)    Leukocytes,Ua SMALL (*)    Bacteria, UA MANY (*)    All other components within normal limits  URINE CULTURE    EKG   Radiology No results found.  Procedures Procedures (including critical care time)  Medications Ordered in UC Medications - No data to display  Initial Impression / Assessment and Plan / UC Course  I have reviewed the triage vital signs and the nursing notes.  Pertinent labs & imaging results that were available during my care of the patient were reviewed by me and considered in my medical decision making (see chart for details).   52 year old female presents for 5 to 6-day history of dysuria, frequency or urgency.   UA shows greater than 1.030 specific gravity, moderate hemoglobin, small bili, ketones, protein, small excite, many bacteria many yeast.  Consistent with UTI.  Urine sent for culture.  Treating with Macrobid, Pyridium.  Also sent Diflucan for suspected vaginal yeast infection.  Will amend therapy  based on culture result.  Reviewed return  precautions.   Final Clinical Impressions(s) / UC Diagnoses   Final diagnoses:  Acute cystitis without hematuria  Dysuria  Urinary frequency  Vaginal yeast infection     Discharge Instructions      UTI: Based on either symptoms or urinalysis, you may have a urinary tract infection. We will send the urine for culture and call with results in a few days. Begin antibiotics at this time. Your symptoms should be much improved over the next 2-3 days. Increase rest and fluid intake. If for some reason symptoms are worsening or not improving after a couple of days or the urine culture determines the antibiotics you are taking will not treat the infection, the antibiotics may be changed. Return or go to ER for fever, back pain, worsening urinary pain, discharge, increased blood in urine. May take Tylenol or Motrin OTC for pain relief or consider AZO if no contraindications      ED Prescriptions     Medication Sig Dispense Auth. Provider   phenazopyridine (PYRIDIUM) 200 MG tablet Take 1 tablet (200 mg total) by mouth 3 (three) times daily. 6 tablet Eusebio Friendly B, PA-C   nitrofurantoin, macrocrystal-monohydrate, (MACROBID) 100 MG capsule Take 1 capsule (100 mg total) by mouth 2 (two) times daily. 10 capsule Eusebio Friendly B, PA-C   fluconazole (DIFLUCAN) 150 MG tablet Take 1 tablet (150 mg total) by mouth daily for 1 day. 1 tablet Gareth Morgan      PDMP not reviewed this encounter.   Shirlee Latch, PA-C 03/08/23 [phone]

## 2023-03-08 NOTE — Discharge Instructions (Signed)

## 2023-03-09 LAB — URINE CULTURE

## 2023-03-10 LAB — URINE CULTURE: Culture: >=100000 — AB

## 2023-09-12 ENCOUNTER — Ambulatory Visit
Admission: EM | Admit: 2023-09-12 | Discharge: 2023-09-12 | Disposition: A | Discharge status: Home / Self Care | Payer: BC Managed Care – PPO | Attending: Physician Assistant | Admitting: Physician Assistant

## 2023-09-12 ENCOUNTER — Encounter: Payer: Self-pay | Admitting: Emergency Medicine

## 2023-09-12 DIAGNOSIS — N3000 Acute cystitis without hematuria: Secondary | ICD-10-CM

## 2023-09-12 DIAGNOSIS — T3695XA Adverse effect of unspecified systemic antibiotic, initial encounter: Secondary | ICD-10-CM | POA: Diagnosis present

## 2023-09-12 DIAGNOSIS — B379 Candidiasis, unspecified: Secondary | ICD-10-CM | POA: Diagnosis present

## 2023-09-12 DIAGNOSIS — R350 Frequency of micturition: Secondary | ICD-10-CM

## 2023-09-12 DIAGNOSIS — R300 Dysuria: Secondary | ICD-10-CM

## 2023-09-12 LAB — URINALYSIS, W/ REFLEX TO CULTURE (INFECTION SUSPECTED)
Bilirubin Urine: NEGATIVE
Glucose, UA: NEGATIVE mg/dL
Hgb urine dipstick: NEGATIVE
Ketones, ur: NEGATIVE mg/dL
Nitrite: NEGATIVE
Protein, ur: NEGATIVE mg/dL
Specific Gravity, Urine: 1.01 (ref 1.005–1.030)
pH: 7 (ref 5.0–8.0)

## 2023-09-12 MED ORDER — FLUCONAZOLE 150 MG PO TABS: ORAL_TABLET | ORAL | 0 refills | Status: DC

## 2023-09-12 MED ORDER — NITROFURANTOIN MONOHYD MACRO 100 MG PO CAPS: 100.0000 mg | ORAL_CAPSULE | Freq: Two times a day (BID) | ORAL | 0 refills | Status: AC

## 2023-09-12 NOTE — ED Triage Notes (Signed)
Patient c/o dysuria and urinary urgency and frequency that started 2 weeks ago.  Patient reports dark and odor from her urine.

## 2023-09-12 NOTE — Discharge Instructions (Signed)

## 2023-09-12 NOTE — ED Provider Notes (Signed)
[facility]    CSN: [id number] Arrival date & time: 09/12/23  1246      History   Chief Complaint Chief Complaint  Patient presents with   Dysuria   Urinary Frequency    HPI Traci Taylor is a 52 y.o. female presenting for 2 week history of dysuria, frequency and urgency.  Also reports dark and foul-smelling urine.  Nausea last night.  Denies fever, fatigue, chills, sweats, flank pain, vomiting, pelvic pain, vaginal discharge/itching or odor.  Reports history of UTIs and feels like this is UTI.  No recent medications taken for symptoms.  Patient reports that she frequently gets yeast infections with antibiotic use and if she needs an antibiotic she would like a Diflucan prescription.  No other complaints.  HPI  Past Medical History:  Diagnosis Date   Obesity     Patient Active Problem List   Diagnosis Date Noted   Essential hypertension 08/19/2020   History of gastric bypass 08/19/2020   Menopausal symptom 11/29/2017    Past Surgical History:  Procedure Laterality Date   CESAREAN SECTION     CHOLECYSTECTOMY     GASTRIC BYPASS      OB History     Gravida  1   Para  1   Term  1   Preterm      AB      Living  1      SAB      IAB      Ectopic      Multiple      Live Births  1            Home Medications    Prior to Admission medications   Medication Sig Start Date End Date Taking? Authorizing Provider  fluconazole (DIFLUCAN) 150 MG tablet Take 1 tab p.o. every 72 hours as needed yeast infection 09/12/23  Yes Shirlee Latch, PA-C  nitrofurantoin, macrocrystal-monohydrate, (MACROBID) 100 MG capsule Take 1 capsule (100 mg total) by mouth 2 (two) times daily. 09/12/23  Yes Eusebio Friendly B, PA-C  cyanocobalamin (,VITAMIN B-12,) 1000 MCG/ML injection Inject into the muscle. 10/02/19   [provider]  hydrochlorothiazide (HYDRODIURIL) 12.5 MG tablet Take by mouth. 03/12/20   [provider]  ibuprofen (ADVIL) 200 MG  tablet Take by mouth.    [provider]    Family History Family History  Problem Relation Age of Onset   Lung cancer Father     Social History Social History   Tobacco Use   Smoking status: Never   Smokeless tobacco: Never  Vaping Use   Vaping status: Never Used  Substance Use Topics   Alcohol use: No    Alcohol/week: 0.0 standard drinks of alcohol   Drug use: No     Allergies   Patient has no known allergies.   Review of Systems Review of Systems  Constitutional:  Negative for chills, fatigue and fever.  Gastrointestinal:  Positive for nausea. Negative for abdominal pain, diarrhea and vomiting.  Genitourinary:  Positive for dysuria, frequency and urgency. Negative for decreased urine volume, flank pain, hematuria, pelvic pain, vaginal bleeding, vaginal discharge and vaginal pain.  Musculoskeletal:  Negative for back pain.  Skin:  Negative for rash.     Physical Exam Triage Vital Signs ED Triage Vitals  Enc Vitals Group     BP      Pulse      Resp      Temp      Temp  src      SpO2      Weight      Height      Head Circumference      Peak Flow      Pain Score      Pain Loc      Pain Edu?      Excl. in GC?    No data found.  Updated Vital Signs BP 137/89 (BP Location: Right Arm)   Pulse 68   Temp 98.5 F (36.9 C) (Oral)   Resp 14   Ht 5\' 3"  (1.6 m)   Wt 200 lb 9.9 oz (91 kg)   SpO2 98%   BMI 35.54 kg/m      Physical Exam Vitals and nursing note reviewed.  Constitutional:      General: She is not in acute distress.    Appearance: Normal appearance. She is not ill-appearing or toxic-appearing.  HENT:     Head: Normocephalic and atraumatic.  Eyes:     General: No scleral icterus.       Right eye: No discharge.        Left eye: No discharge.     Conjunctiva/sclera: Conjunctivae normal.  Cardiovascular:     Rate and Rhythm: Normal rate and regular rhythm.     Heart sounds: Normal heart sounds.  Pulmonary:     Effort:  Pulmonary effort is normal. No respiratory distress.     Breath sounds: Normal breath sounds.  Abdominal:     Palpations: Abdomen is soft.     Tenderness: There is no abdominal tenderness. There is no right CVA tenderness or left CVA tenderness.  Musculoskeletal:     Cervical back: Neck supple.  Skin:    General: Skin is dry.  Neurological:     General: No focal deficit present.     Mental Status: She is alert. Mental status is at baseline.     Motor: No weakness.     Gait: Gait normal.  Psychiatric:        Mood and Affect: Mood normal.        Behavior: Behavior normal.      UC Treatments / Results  Labs (all labs ordered are listed, but only abnormal results are displayed) Labs Reviewed  URINALYSIS, W/ REFLEX TO CULTURE (INFECTION SUSPECTED) - Abnormal; Notable for the following components:      Result Value   Leukocytes,Ua TRACE (*)    Bacteria, UA MANY (*)    All other components within normal limits  URINE CULTURE     EKG   Radiology No results found.  Procedures Procedures (including critical care time)  Medications Ordered in UC Medications - No data to display  Initial Impression / Assessment and Plan / UC Course  I have reviewed the triage vital signs and the nursing notes.  Pertinent labs & imaging results that were available during my care of the patient were reviewed by me and considered in my medical decision making (see chart for details).   52 year old female presents for 2 week history of dysuria, frequency or urgency.   UA shows greater than 1.010 specific gravity, no hemoglobin, trace leuks, many bacteria many yeast.  Consistent with UTI.  Urine sent for culture.  Treating with Macrobid. Also sent Diflucan in case of vaginal yeast infection.  Will amend therapy based on culture result.  Reviewed return precautions.   Final Clinical Impressions(s) / UC Diagnoses   Final diagnoses:  Acute cystitis without hematuria  Dysuria  Urinary  frequency  Antibiotic-induced yeast infection     Discharge Instructions      UTI: Based on either symptoms or urinalysis, you may have a urinary tract infection. We will send the urine for culture and call with results in a few days. Begin antibiotics at this time. Your symptoms should be much improved over the next 2-3 days. Increase rest and fluid intake. If for some reason symptoms are worsening or not improving after a couple of days or the urine culture determines the antibiotics you are taking will not treat the infection, the antibiotics may be changed. Return or go to ER for fever, back pain, worsening urinary pain, discharge, increased blood in urine. May take Tylenol or Motrin OTC for pain relief or consider AZO if no contraindications       ED Prescriptions     Medication Sig Dispense Auth. Provider   nitrofurantoin, macrocrystal-monohydrate, (MACROBID) 100 MG capsule Take 1 capsule (100 mg total) by mouth 2 (two) times daily. 10 capsule Eusebio Friendly B, PA-C   fluconazole (DIFLUCAN) 150 MG tablet Take 1 tab p.o. every 72 hours as needed yeast infection 2 tablet Shirlee Latch, PA-C      PDMP not reviewed this encounter.      Shirlee Latch, PA-C 09/12/23 1329

## 2023-09-14 LAB — URINE CULTURE: Culture: >=100000 — AB

## 2023-10-11 ENCOUNTER — Ambulatory Visit
Admission: EM | Admit: 2023-10-11 | Discharge: 2023-10-11 | Disposition: A | Discharge status: Home / Self Care | Payer: BC Managed Care – PPO | Attending: Family Medicine | Admitting: Family Medicine

## 2023-10-11 DIAGNOSIS — R300 Dysuria: Secondary | ICD-10-CM

## 2023-10-11 DIAGNOSIS — B379 Candidiasis, unspecified: Secondary | ICD-10-CM

## 2023-10-11 LAB — URINALYSIS, W/ REFLEX TO CULTURE (INFECTION SUSPECTED)
Bilirubin Urine: NEGATIVE
Glucose, UA: NEGATIVE mg/dL
Ketones, ur: NEGATIVE mg/dL
Leukocytes,Ua: NEGATIVE
Nitrite: NEGATIVE
Protein, ur: NEGATIVE mg/dL
Specific Gravity, Urine: 1.015 (ref 1.005–1.030)
pH: 7 (ref 5.0–8.0)

## 2023-10-11 MED ORDER — FLUCONAZOLE 150 MG PO TABS: 150.0000 mg | ORAL_TABLET | ORAL | 0 refills | Status: AC

## 2023-10-11 NOTE — ED Triage Notes (Signed)
 Sx x 1 week  Strong dark urine in the mornings.  Urinary frequency Dysuria

## 2023-10-11 NOTE — Discharge Instructions (Signed)
 Stop by the pharmacy to pick up your prescription. I sent your urine for culture.  Someone may call you to start antibiotics, if your urine culture is positive.

## 2023-10-12 LAB — URINE CULTURE

## 2023-10-16 NOTE — ED Provider Notes (Signed)
 [facility]    CSN: [id number] Arrival date & time: 10/11/23  1454      History   Chief Complaint Chief Complaint  Patient presents with   Urinary Frequency   Dysuria     HPI HPI Traci Taylor is a 53 y.o. female.    Traci Taylor presents for strong smelling urine, dysuria and urinary frequency for the past week.  Notes that she had similar symptoms last month and took antibiotics for it.  Denies known STI exposure.  No LMP recorded. Patient is postmenopausal.         Past Medical History:  Diagnosis Date   Obesity     Patient Active Problem List   Diagnosis Date Noted   Essential hypertension 08/19/2020   History of gastric bypass 08/19/2020   Menopausal symptom 11/29/2017    Past Surgical History:  Procedure Laterality Date   CESAREAN SECTION     CHOLECYSTECTOMY     GASTRIC BYPASS      OB History     Gravida  1   Para  1   Term  1   Preterm      AB      Living  1      SAB      IAB      Ectopic      Multiple      Live Births  1            Home Medications    Prior to Admission medications   Medication Sig Start Date End Date Taking? Authorizing Provider  cyanocobalamin (,VITAMIN B-12,) 1000 MCG/ML injection Inject into the muscle. 10/02/19   [provider]  hydrochlorothiazide (HYDRODIURIL) 12.5 MG tablet Take by mouth. 03/12/20   [provider]  ibuprofen (ADVIL) 200 MG tablet Take by mouth.    [provider]  nitrofurantoin , macrocrystal-monohydrate, (MACROBID ) 100 MG capsule Take 1 capsule (100 mg total) by mouth 2 (two) times daily. 09/12/23   Arvis Jolan NOVAK, PA-C    Family History Family History  Problem Relation Age of Onset   Lung cancer Father     Social History Social History   Tobacco Use   Smoking status: Never   Smokeless tobacco: Never  Vaping Use   Vaping status: Never Used  Substance Use Topics   Alcohol use: No    Alcohol/week: 0.0 standard drinks of alcohol    Drug use: No     Allergies   Patient has no known allergies.   Review of Systems Review of Systems: :negative unless otherwise stated in HPI.      Physical Exam Triage Vital Signs ED Triage Vitals  Encounter Vitals Group     BP 10/11/23 1610 130/89     Systolic BP Percentile --      Diastolic BP Percentile --      Pulse Rate 10/11/23 1610 62     Resp 10/11/23 1610 19     Temp 10/11/23 1610 98.6 F (37 C)     Temp Source 10/11/23 1610 Oral     SpO2 10/11/23 1610 94 %     Weight --      Height --      Head Circumference --      Peak Flow --      Pain Score 10/11/23 1609 6     Pain Loc --      Pain Education --      Exclude from Growth Chart --  No data found.  Updated Vital Signs BP 130/89 (BP Location: Right Arm)   Pulse 62   Temp 98.6 F (37 C) (Oral)   Resp 19   SpO2 94%   Visual Acuity Right Eye Distance:   Left Eye Distance:   Bilateral Distance:    Right Eye Near:   Left Eye Near:    Bilateral Near:     Physical Exam GEN: well appearing female in no acute distress  CVS: well perfused  RESP: speaking in full sentences without pause      UC Treatments / Results  Labs (all labs ordered are listed, but only abnormal results are displayed) Labs Reviewed  URINE CULTURE - Abnormal; Notable for the following components:      Result Value   Culture MULTIPLE SPECIES PRESENT, SUGGEST RECOLLECTION (*)    All other components within normal limits  URINALYSIS, W/ REFLEX TO CULTURE (INFECTION SUSPECTED) - Abnormal; Notable for the following components:   Hgb urine dipstick TRACE (*)    Bacteria, UA RARE (*)    All other components within normal limits    EKG   Radiology No results found.  Procedures Procedures (including critical care time)  Medications Ordered in UC Medications - No data to display  Initial Impression / Assessment and Plan / UC Course  I have reviewed the triage vital signs and the nursing notes.  Pertinent labs &  imaging results that were available during my care of the patient were reviewed by me and considered in my medical decision making (see chart for details).      Patient is a 53 y.o.[name] female  who presents for dysuria.  Overall patient is well-appearing and afebrile.  Vital signs stable.  UA not consistent with acute cystitis.   Hematuria not supported on microscopy.  Antibiotics deferred for now urine culture obtained.  Follow-up sensitivities and change antibiotics, if needed.  Of note, her urinalysis did show some budding yeast.  Treat with Diflucan  for 2 doses    Return precautions including abdominal pain, fever, chills, nausea, or vomiting given. Discussed MDM, treatment plan and plan for follow-up with patient who agrees with plan.        Final Clinical Impressions(s) / UC Diagnoses   Final diagnoses:  Dysuria  Yeast infection     Discharge Instructions      Stop by the pharmacy to pick up your prescription. I sent your urine for culture.  Someone may call you to start antibiotics, if your urine culture is positive.      ED Prescriptions     Medication Sig Dispense Auth. Provider   fluconazole  (DIFLUCAN ) 150 MG tablet Take 1 tablet (150 mg total) by mouth every 3 (three) days for 2 doses. 2 tablet Kriste Berth, DO      PDMP not reviewed this encounter.   Kriste Berth, DO 10/16/23 1851

## 2023-12-03 ENCOUNTER — Other Ambulatory Visit: Payer: Self-pay | Admitting: Family Medicine

## 2023-12-03 DIAGNOSIS — Z1231 Encounter for screening mammogram for malignant neoplasm of breast: Secondary | ICD-10-CM
